# Patient Record
Sex: Female | Born: 1990 | Race: Black or African American | Hispanic: No | Marital: Single | Smoking: Never smoker
Health system: Southern US, Community
[De-identification: ages and names within clinical notes are randomized; demographics above are authoritative.]

## PROBLEM LIST (undated history)

## (undated) ENCOUNTER — Inpatient Hospital Stay (HOSPITAL_COMMUNITY): Payer: Self-pay

## (undated) DIAGNOSIS — B9689 Other specified bacterial agents as the cause of diseases classified elsewhere: Secondary | ICD-10-CM

## (undated) DIAGNOSIS — Z789 Other specified health status: Secondary | ICD-10-CM

## (undated) DIAGNOSIS — N760 Acute vaginitis: Secondary | ICD-10-CM

## (undated) DIAGNOSIS — B3731 Acute candidiasis of vulva and vagina: Secondary | ICD-10-CM

## (undated) DIAGNOSIS — B373 Candidiasis of vulva and vagina: Secondary | ICD-10-CM

## (undated) HISTORY — DX: Acute vaginitis: N76.0

## (undated) HISTORY — DX: Candidiasis of vulva and vagina: B37.3

## (undated) HISTORY — PX: NO PAST SURGERIES: SHX2092

## (undated) HISTORY — DX: Other specified bacterial agents as the cause of diseases classified elsewhere: B96.89

## (undated) HISTORY — DX: Acute candidiasis of vulva and vagina: B37.31

---

## 2016-12-06 ENCOUNTER — Other Ambulatory Visit: Payer: Self-pay

## 2016-12-10 LAB — CYTOLOGY - PAP: DIAGNOSIS: NEGATIVE

## 2019-06-26 ENCOUNTER — Encounter (HOSPITAL_COMMUNITY): Payer: Self-pay | Admitting: Obstetrics and Gynecology

## 2019-06-26 ENCOUNTER — Inpatient Hospital Stay (HOSPITAL_COMMUNITY): Payer: BLUE CROSS/BLUE SHIELD

## 2019-06-26 ENCOUNTER — Other Ambulatory Visit: Payer: Self-pay

## 2019-06-26 ENCOUNTER — Inpatient Hospital Stay (HOSPITAL_COMMUNITY)
Admission: AD | Admit: 2019-06-26 | Discharge: 2019-06-26 | Disposition: A | Discharge status: Home / Self Care | Payer: BLUE CROSS/BLUE SHIELD | Attending: Obstetrics and Gynecology | Admitting: Obstetrics and Gynecology

## 2019-06-26 DIAGNOSIS — O208 Other hemorrhage in early pregnancy: Secondary | ICD-10-CM | POA: Diagnosis not present

## 2019-06-26 DIAGNOSIS — O26891 Other specified pregnancy related conditions, first trimester: Secondary | ICD-10-CM | POA: Diagnosis not present

## 2019-06-26 DIAGNOSIS — K117 Disturbances of salivary secretion: Secondary | ICD-10-CM | POA: Diagnosis not present

## 2019-06-26 DIAGNOSIS — O219 Vomiting of pregnancy, unspecified: Secondary | ICD-10-CM | POA: Diagnosis not present

## 2019-06-26 DIAGNOSIS — O418X10 Other specified disorders of amniotic fluid and membranes, first trimester, not applicable or unspecified: Secondary | ICD-10-CM

## 2019-06-26 DIAGNOSIS — Z3A08 8 weeks gestation of pregnancy: Secondary | ICD-10-CM | POA: Insufficient documentation

## 2019-06-26 DIAGNOSIS — O468X1 Other antepartum hemorrhage, first trimester: Secondary | ICD-10-CM

## 2019-06-26 DIAGNOSIS — O99611 Diseases of the digestive system complicating pregnancy, first trimester: Secondary | ICD-10-CM | POA: Insufficient documentation

## 2019-06-26 DIAGNOSIS — R1030 Lower abdominal pain, unspecified: Secondary | ICD-10-CM | POA: Diagnosis not present

## 2019-06-26 DIAGNOSIS — R112 Nausea with vomiting, unspecified: Secondary | ICD-10-CM | POA: Diagnosis present

## 2019-06-26 DIAGNOSIS — Z3491 Encounter for supervision of normal pregnancy, unspecified, first trimester: Secondary | ICD-10-CM

## 2019-06-26 DIAGNOSIS — R109 Unspecified abdominal pain: Secondary | ICD-10-CM

## 2019-06-26 HISTORY — DX: Other specified health status: Z78.9

## 2019-06-26 LAB — CBC
HCT: 35.3 % — ABNORMAL LOW (ref 36.0–46.0)
Hemoglobin: 11.9 g/dL — ABNORMAL LOW (ref 12.0–15.0)
MCH: 26.7 pg (ref 26.0–34.0)
MCHC: 33.7 g/dL (ref 30.0–36.0)
MCV: 79.3 fL — ABNORMAL LOW (ref 80.0–100.0)
Platelets: 179 10*3/uL (ref 150–400)
RBC: 4.45 MIL/uL (ref 3.87–5.11)
RDW: 12.7 % (ref 11.5–15.5)
WBC: 4.6 10*3/uL (ref 4.0–10.5)
nRBC: 0 % (ref 0.0–0.2)

## 2019-06-26 LAB — URINALYSIS, ROUTINE W REFLEX MICROSCOPIC
Bilirubin Urine: NEGATIVE
Glucose, UA: NEGATIVE mg/dL
Hgb urine dipstick: NEGATIVE
Ketones, ur: 80 mg/dL — AB
Nitrite: NEGATIVE
Protein, ur: 100 mg/dL — AB
Specific Gravity, Urine: 1.023 (ref 1.005–1.030)
pH: 7 (ref 5.0–8.0)

## 2019-06-26 LAB — WET PREP, GENITAL
Clue Cells Wet Prep HPF POC: NONE SEEN
Sperm: NONE SEEN
Trich, Wet Prep: NONE SEEN
Yeast Wet Prep HPF POC: NONE SEEN

## 2019-06-26 LAB — HCG, QUANTITATIVE, PREGNANCY: hCG, Beta Chain, Quant, S: 361974 m[IU]/mL — ABNORMAL HIGH (ref <5)

## 2019-06-26 LAB — POCT PREGNANCY, URINE: Preg Test, Ur: POSITIVE — AB

## 2019-06-26 LAB — HIV ANTIBODY (ROUTINE TESTING W REFLEX): HIV Screen 4th Generation wRfx: NONREACTIVE

## 2019-06-26 MED ORDER — ONDANSETRON 8 MG PO TBDP: 8.0000 mg | ORAL_TABLET | Freq: Three times a day (TID) | ORAL | 2 refills | Status: DC | PRN

## 2019-06-26 MED ORDER — SODIUM CHLORIDE 0.9 % IV SOLN
8.0000 mg | Freq: Once | INTRAVENOUS | Status: AC
  Administered 2019-06-26: 8 mg via INTRAVENOUS
  Filled 2019-06-26: qty 4

## 2019-06-26 MED ORDER — CONCEPT OB 130-92.4-1 MG PO CAPS: 1.0000 | ORAL_CAPSULE | Freq: Every day | ORAL | 12 refills | Status: DC

## 2019-06-26 MED ORDER — ACETAMINOPHEN 500 MG PO TABS: 1000.0000 mg | ORAL_TABLET | Freq: Four times a day (QID) | ORAL | 1 refills | Status: DC | PRN

## 2019-06-26 MED ORDER — PROMETHAZINE HCL 25 MG/ML IJ SOLN
25.0000 mg | Freq: Once | INTRAVENOUS | Status: AC
  Administered 2019-06-26: 25 mg via INTRAVENOUS
  Filled 2019-06-26: qty 1

## 2019-06-26 MED ORDER — GLYCOPYRROLATE 0.2 MG/ML IJ SOLN
0.2000 mg | Freq: Once | INTRAMUSCULAR | Status: AC
  Administered 2019-06-26: 0.2 mg via INTRAVENOUS
  Filled 2019-06-26: qty 1

## 2019-06-26 MED ORDER — GLYCOPYRROLATE 2 MG PO TABS: 2.0000 mg | ORAL_TABLET | Freq: Three times a day (TID) | ORAL | 2 refills | Status: DC | PRN

## 2019-06-26 NOTE — Discharge Instructions (Signed)
Abdominal Pain During Pregnancy ° °Abdominal pain is common during pregnancy, and has many possible causes. Some causes are more serious than others, and sometimes the cause is not known. Abdominal pain can be a sign that labor is starting. It can also be caused by normal growth and stretching of muscles and ligaments during pregnancy. Always tell your health care provider if you have any abdominal pain. °Follow these instructions at home: °· Do not have sex or put anything in your vagina until your pain goes away completely. °· Get plenty of rest until your pain improves. °· Drink enough fluid to keep your urine pale yellow. °· Take over-the-counter and prescription medicines only as told by your health care provider. °· Keep all follow-up visits as told by your health care provider. This is important. °Contact a health care provider if: °· Your pain continues or gets worse after resting. °· You have lower abdominal pain that: °? Comes and goes at regular intervals. °? Spreads to your back. °? Is similar to menstrual cramps. °· You have pain or burning when you urinate. °Get help right away if: °· You have a fever or chills. °· You have vaginal bleeding. °· You are leaking fluid from your vagina. °· You are passing tissue from your vagina. °· You have vomiting or diarrhea that lasts for more than 24 hours. °· Your baby is moving less than usual. °· You feel very weak or faint. °· You have shortness of breath. °· You develop severe pain in your upper abdomen. °Summary °· Abdominal pain is common during pregnancy, and has many possible causes. °· If you experience abdominal pain during pregnancy, tell your health care provider right away. °· Follow your health care provider's home care instructions and keep all follow-up visits as directed. °This information is not intended to replace advice given to you by your health care provider. Make sure you discuss any questions you have with your health care  provider. °Document Released: 06/14/2005 Document Revised: 10/02/2018 Document Reviewed: 09/16/2016 °Elsevier Patient Education © 2020 Elsevier Inc[employer] ° °Morning Sickness ° °Morning sickness is when a woman feels nauseous during pregnancy. This nauseous feeling may or may not come with vomiting. It often occurs in the morning, but it can be a problem at any time of day. Morning sickness is most common during the first trimester. In some cases, it may continue throughout pregnancy. Although morning sickness is unpleasant, it is usually harmless unless the woman develops severe and continual vomiting (hyperemesis gravidarum), a condition that requires more intense treatment. °What are the causes? °The exact cause of this condition is not known, but it seems to be related to normal hormonal changes that occur in pregnancy. °What increases the risk? °You are more likely to develop this condition if: °· You experienced nausea or vomiting before your pregnancy. °· You had morning sickness during a previous pregnancy. °· You are pregnant with more than one baby, such as twins. °What are the signs or symptoms? °Symptoms of this condition include: °· Nausea. °· Vomiting. °How is this diagnosed? °This condition is usually diagnosed based on your signs and symptoms. °How is this treated? °In many cases, treatment is not needed for this condition. Making some changes to what you eat may help to control symptoms. Your health care provider may also prescribe or recommend: °· Vitamin B6 supplements. °· Anti-nausea medicines. °· Ginger. °Follow these instructions at home: °Medicines °· Take over-the-counter and prescription medicines only as told by your health care   provider. Do not use any prescription, over-the-counter, or herbal medicines for morning sickness without first talking with your health care provider. °· Taking multivitamins before getting pregnant can prevent or decrease the severity of morning sickness in most  women. °Eating and drinking °· Eat a piece of dry toast or crackers before getting out of bed in the morning. °· Eat 5 or 6 small meals a day. °· Eat dry and bland foods, such as rice or a baked potato. Foods that are high in carbohydrates are often helpful. °· Avoid greasy, fatty, and spicy foods. °· Have someone cook for you if the smell of any food causes nausea and vomiting. °· If you feel nauseous after taking prenatal vitamins, take the vitamins at night or with a snack. °· Snack on protein foods between meals if you are hungry. Nuts, yogurt, and cheese are good options. °· Drink fluids throughout the day. °· Try ginger ale made with real ginger, ginger tea made from fresh grated ginger, or ginger candies. °General instructions °· Do not use any products that contain nicotine or tobacco, such as cigarettes and e-cigarettes. If you need help quitting, ask your health care provider. °· Get an air purifier to keep the air in your house free of odors. °· Get plenty of fresh air. °· Try to avoid odors that trigger your nausea. °· Consider trying these methods to help relieve symptoms: °? Wearing an acupressure wristband. These wristbands are often worn for seasickness. °? Acupuncture. °Contact a health care provider if: °· Your home remedies are not working and you need medicine. °· You feel dizzy or light-headed. °· You are losing weight. °Get help right away if: °· You have persistent and uncontrolled nausea and vomiting. °· You faint. °· You have severe pain in your abdomen. °Summary °· Morning sickness is when a woman feels nauseous during pregnancy. This nauseous feeling may or may not come with vomiting. °· Morning sickness is most common during the first trimester. °· It often occurs in the morning, but it can be a problem at any time of day. °· In many cases, treatment is not needed for this condition. Making some changes to what you eat may help to control symptoms. °This information is not intended to  replace advice given to you by your health care provider. Make sure you discuss any questions you have with your health care provider. °Document Released: 08/05/2006 Document Revised: 05/27/2017 Document Reviewed: 07/17/2016 °Elsevier Patient Education © 2020 Elsevier Inc. ° °

## 2019-06-26 NOTE — MAU Note (Addendum)
Pt states she has been having nausea and vomiting for 2 weeks. Tried B6 and Unisom and it only worked for a while. Also having abdominal cramping at night. Denies bleeding or vaginal discharge. Had her pregnancy confirmed at Pregnancy center. LMP Nov 2

## 2019-06-26 NOTE — MAU Provider Note (Signed)
Chief Complaint: Nausea and Emesis   First Provider Initiated Contact with Patient 06/26/19 1729     SUBJECTIVE HPI: Anne Young is a 28 y.o. G1P0 at [redacted]w[redacted]d who presents to Maternity Admissions reporting nausea and vomiting and increased saliva x2 weeks that has not improved with vitamin B6 and Unisom.  Also reports lower abdominal cramping times several days.  Positive UPT at pregnancy care center.  Has not had any ultrasounds this pregnancy.  Vaginal Bleeding: Denies Passage of tissue or clots: Denies Dizziness: Denies  Pain Location: Suprapubic Quality: Cramping Severity: Moderate Duration: Several days Course: Unchanged Context: Early pregnancy.  IUP not yet confirmed. Timing: Intermittent, worse at night Modifying factors: Nothing.  Has not tried anything for the pain. Associated signs and symptoms: Negative for fever, chills, diarrhea, constipation, urinary complaints, vaginal discharge.  Positive for nausea and vomiting.  Past Medical History:  Diagnosis Date  . Medical history non-contributory    OB History  Gravida Para Term Preterm AB Living  1            SAB TAB Ectopic Multiple Live Births               # Outcome Date GA Lbr Len/2nd Weight Sex Delivery Anes PTL Lv  1 Current            Past Surgical History:  Procedure Laterality Date  . NO PAST SURGERIES     Social History   Socioeconomic History  . Marital status: Single    Spouse name: Not on file  . Number of children: Not on file  . Years of education: Not on file  . Highest education level: Not on file  Occupational History  . Not on file  Tobacco Use  . Smoking status: Never Smoker  . Smokeless tobacco: Never Used  Substance and Sexual Activity  . Alcohol use: Never  . Drug use: Never  . Sexual activity: Not on file  Other Topics Concern  . Not on file  Social History Narrative  . Not on file   Social Determinants of Health   Financial Resource Strain:   . Difficulty of Paying Living  Expenses: Not on file  Food Insecurity:   . Worried About Programme researcher, broadcasting/film/video in the Last Year: Not on file  . Ran Out of Food in the Last Year: Not on file  Transportation Needs:   . Lack of Transportation (Medical): Not on file  . Lack of Transportation (Non-Medical): Not on file  Physical Activity:   . Days of Exercise per Week: Not on file  . Minutes of Exercise per Session: Not on file  Stress:   . Feeling of Stress : Not on file  Social Connections:   . Frequency of Communication with Friends and Family: Not on file  . Frequency of Social Gatherings with Friends and Family: Not on file  . Attends Religious Services: Not on file  . Active Member of Clubs or Organizations: Not on file  . Attends Banker Meetings: Not on file  . Marital Status: Not on file  Intimate Partner Violence:   . Fear of Current or Ex-Partner: Not on file  . Emotionally Abused: Not on file  . Physically Abused: Not on file  . Sexually Abused: Not on file   No current facility-administered medications on file prior to encounter.   No current outpatient medications on file prior to encounter.   No Known Allergies  I have reviewed the past Medical Hx,  Surgical Hx, Social Hx, Allergies and Medications.   Review of Systems  Constitutional: Negative for appetite change, chills and fever.  Gastrointestinal: Positive for abdominal pain, nausea and vomiting. Negative for constipation and diarrhea.       Ptyalism  Genitourinary: Negative for difficulty urinating, dysuria, flank pain, frequency, hematuria, pelvic pain, vaginal bleeding and vaginal discharge.    OBJECTIVE Patient Vitals for the past 24 hrs:  BP Temp Temp src Pulse Resp SpO2 Height Weight  06/26/19 2048 (!) 107/57 -- -- -- -- -- -- --  06/26/19 1542 (!) 110/59 98.5 F (36.9 C) Oral 84 20 100 % -- --  06/26/19 1539 -- -- -- -- -- -- 5\' 8"  (1.727 m) 69.9 kg   Constitutional: Well-developed, well-nourished female in no acute  distress.  Head: Mucous membranes moist. Cardiovascular: normal rate Respiratory: normal rate and effort.  GI: Abd soft, moderate suprapubic tenderness.  Fundus nonpalpable. MS: Extremities nontender, no edema, normal ROM Neurologic: Alert and oriented x 4.  GU:  SPECULUM EXAM: NEFG, physiologic discharge, no blood noted, cervix clean  Closed; uterus 8-week size, no adnexal tenderness or masses. No CMT.  LAB RESULTS Results for orders placed or performed during the hospital encounter of 06/26/19 (from the past 24 hour(s))  Urinalysis, Routine w reflex microscopic     Status: Abnormal   Collection Time: 06/26/19  3:45 PM  Result Value Ref Range   Color, Urine AMBER (A) YELLOW   APPearance HAZY (A) CLEAR   Specific Gravity, Urine 1.023 1.005 - 1.030   pH 7.0 5.0 - 8.0   Glucose, UA NEGATIVE NEGATIVE mg/dL   Hgb urine dipstick NEGATIVE NEGATIVE   Bilirubin Urine NEGATIVE NEGATIVE   Ketones, ur 80 (A) NEGATIVE mg/dL   Protein, ur 161100 (A) NEGATIVE mg/dL   Nitrite NEGATIVE NEGATIVE   Leukocytes,Ua TRACE (A) NEGATIVE   RBC / HPF 0-5 0 - 5 RBC/hpf   WBC, UA 11-20 0 - 5 WBC/hpf   Bacteria, UA MANY (A) NONE SEEN   Squamous Epithelial / LPF 0-5 0 - 5   Mucus PRESENT   Pregnancy, urine POC     Status: Abnormal   Collection Time: 06/26/19  3:46 PM  Result Value Ref Range   Preg Test, Ur POSITIVE (A) NEGATIVE  hCG, quantitative, pregnancy     Status: Abnormal   Collection Time: 06/26/19  5:40 PM  Result Value Ref Range   hCG, Beta Chain, Quant, S 361,974 (H) <5 mIU/mL  CBC     Status: Abnormal   Collection Time: 06/26/19  5:40 PM  Result Value Ref Range   WBC 4.6 4.0 - 10.5 K/uL   RBC 4.45 3.87 - 5.11 MIL/uL   Hemoglobin 11.9 (L) 12.0 - 15.0 g/dL   HCT 09.635.3 (L) 04.536.0 - 40.946.0 %   MCV 79.3 (L) 80.0 - 100.0 fL   MCH 26.7 26.0 - 34.0 pg   MCHC 33.7 30.0 - 36.0 g/dL   RDW 81.112.7 91.411.5 - 78.215.5 %   Platelets 179 150 - 400 K/uL   nRBC 0.0 0.0 - 0.2 %  ABO/Rh     Status: None   Collection  Time: 06/26/19  5:40 PM  Result Value Ref Range   ABO/RH(D)      A POS Performed at [facility], [address] [place], [place] [id number]   HIV Antibody (routine testing w rflx)     Status: None   Collection Time: 06/26/19  5:40 PM  Result Value Ref Range  HIV Screen 4th Generation wRfx NON REACTIVE NON REACTIVE  Wet prep, genital     Status: Abnormal   Collection Time: 06/26/19  7:36 PM   Specimen: Vaginal  Result Value Ref Range   Yeast Wet Prep HPF POC NONE SEEN NONE SEEN   Trich, Wet Prep NONE SEEN NONE SEEN   Clue Cells Wet Prep HPF POC NONE SEEN NONE SEEN   WBC, Wet Prep HPF POC MODERATE (A) NONE SEEN   Sperm NONE SEEN     IMAGING US OB Comp Less 14 Wks  Result Date: 06/26/2019 CLINICAL DATA:  Nightly cramping EXAM: OBSTETRIC <14 WK ULTRASOUND TECHNIQUE: Transabdominal ultrasound was performed for evaluation of the gestation as well as the maternal uterus and adnexal regions. COMPARISON:  None. FINDINGS: Intrauterine gestational sac: Single Yolk sac:  Visualized. Embryo:  Visualized. Cardiac Activity: Visualized. Heart Rate: 169 bpm CRL: 22.5  mm   8 w 6 d                  [place] EDC: 01/30/2020 Subchorionic hemorrhage:  Small subchorionic hemorrhage. Maternal uterus/adnexae: Corpus luteum cyst in the right ovary. Ovaries are otherwise normal. No pelvic free fluid. IMPRESSION: 1. Single live intrauterine pregnancy as detailed above. 2. Small subchorionic hemorrhage. Attention on follow-up examination is recommended. Electronically Signed   By: Kathreen Devoid   On: 06/26/2019 18:48    MAU COURSE Orders Placed This Encounter  Procedures  . Wet prep, genital  . US OB Comp Less 14 Wks  . Urinalysis, Routine w reflex microscopic  . hCG, quantitative, pregnancy  . CBC  . HIV Antibody (routine testing w rflx)  . Apply warm compress  . Pregnancy, urine POC  . ABO/Rh  . Insert peripheral IV  . Discharge patient   Meds ordered this encounter  Medications  . promethazine  (PHENERGAN) 25 mg in lactated ringers 1,000 mL infusion  . ondansetron (ZOFRAN) 8 mg in sodium chloride 0.9 % 50 mL IVPB  . glycopyrrolate (ROBINUL) injection 0.2 mg  . glycopyrrolate (ROBINUL) 2 MG tablet    Sig: Take 1 tablet (2 mg total) by mouth 3 (three) times daily as needed.    Dispense:  30 tablet    Refill:  2    Order Specific Question:   Supervising Provider    Answer:   Sloan Leiter [[id number]]  . acetaminophen (TYLENOL) 500 MG tablet    Sig: Take 2 tablets (1,000 mg total) by mouth every 6 (six) hours as needed for moderate pain.    Dispense:  30 tablet    Refill:  1    Order Specific Question:   Supervising Provider    Answer:   Sloan Leiter [[id number]]  . ondansetron (ZOFRAN ODT) 8 MG disintegrating tablet    Sig: Take 1 tablet (8 mg total) by mouth every 8 (eight) hours as needed for nausea or vomiting.    Dispense:  20 tablet    Refill:  2    Order Specific Question:   Supervising Provider    Answer:   Sloan Leiter [[id number]]  . Prenat w/o A Vit-FeFum-FePo-FA (CONCEPT OB) 130-92.4-1 MG CAPS    Sig: Take 1 tablet by mouth daily.    Dispense:  30 capsule    Refill:  12    Order Specific Question:   Supervising Provider    Answer:   Sloan Leiter [[id number]]   No improvement in nausea after Phenergan.  Ate soup and feels like she is about  to vomit again.  Lengthy discussion about small studies raising concern about increased risk of birth defects when Zofran is used during organogenesis followed by a larger study showing no increase in risk of birth defects.  Patient verbalizes understanding and request to have Zofran.   Patient reports feeling much better and looks better as well.  Requesting Rx Zofran for home.  MDM - Pain in early pregnancy with normal intrauterine pregnancy and hemodynamically stable.  Pain possibly secondary to corpus luteum cyst, subchorionic hemorrhage or muscular strain and vomiting.  No evidence of ectopic pregnancy, PID, miscarriage or other  emergent condition.  GC/chlamydia cultures pending.  No leukocytosis.  Comfort measures, extra strength Tylenol as needed for pain.  - Nausea and vomiting of pregnancy will controlled by Zofran.  Rx given.  Instructed to advance diet slowly.  -Ptyalism.  Rx Robinul.  ASSESSMENT 1. Subchorionic hemorrhage of placenta in first trimester, single or unspecified fetus   2. Abdominal pain during pregnancy in first trimester   3. Normal IUP (intrauterine pregnancy) on prenatal ultrasound, first trimester   4. Nausea and vomiting during pregnancy   5. Ptyalism     PLAN Discharge home in stable condition. 1st trimester precautions Follow-up Information    Your obstetrician Follow up.   Why: Start prenatal care       [facility] Follow up.   Specialty: Obstetrics and Gynecology Why: As needed in pregnancy emergencies Contact information: [address] [id number] [place] [address] [phone]         Allergies as of 06/26/2019   No Known Allergies     Medication List    TAKE these medications   acetaminophen 500 MG tablet Commonly known as: TYLENOL Take 2 tablets (1,000 mg total) by mouth every 6 (six) hours as needed for moderate pain.   Concept OB 130-92.4-1 MG Caps Take 1 tablet by mouth daily.   glycopyrrolate 2 MG tablet Commonly known as: ROBINUL Take 1 tablet (2 mg total) by mouth 3 (three) times daily as needed.   ondansetron 8 MG disintegrating tablet Commonly known as: Zofran ODT Take 1 tablet (8 mg total) by mouth every 8 (eight) hours as needed for nausea or vomiting.        Katrinka Blazing, [place], CNM 06/26/2019  9:05 PM  4

## 2019-06-27 LAB — GC/CHLAMYDIA PROBE AMP (~~LOC~~) NOT AT ARMC
Chlamydia: NEGATIVE
Comment: NEGATIVE
Comment: NORMAL
Neisseria Gonorrhea: NEGATIVE

## 2019-06-27 LAB — ABO/RH: ABO/RH(D): A POS

## 2019-06-29 NOTE — L&D Delivery Note (Signed)
[facility]N [facility] Delivery Note  Anne Young is a 29 y.o. G2P0010 s/p vag del at [redacted]w[redacted]d. She was admitted for IOL due to posterm preg.    ROM: 3h 63m (SROM) with light MSF fluid GBS Status: positive Maximum Maternal Temperature: 98.4  Labor Progress: Marland Kitchen Anne Young was admitted for IOL and was given a single dose of buccal cytotec, which she proceeded to labor from and delivered approx 10hrs afterwards.  Delivery Date/Time: February 07, 2020 at 0358 Delivery: Called to room and patient was complete and pushing. Head delivered ROA. No nuchal cord present. Shoulder and body delivered in usual fashion. Infant with spontaneous cry, placed on mother's abdomen, dried and stimulated. Cord clamped x 2 after 1-minute delay, and cut by [name]. Cord blood drawn. Placenta delivered spontaneously with gentle cord traction. Fundus firm with massage and Pitocin. Labia, perineum, vagina, and cervix inspected and found to have a 1st deg perineal lac repaired in the usual fashion with 3-0 monocryl, and a periurethral abrasion, hemostatic and not repaired.   Placenta: spont, intact Complications: none Lacerations: 1st deg perineal EBL: 500cc Analgesia: 1% lidocaine  Postpartum Planning [x]  message to sent to schedule follow-up    Infant: girl  APGARs 8/9  3779gm (8lb 5.3oz)  10/9, CNM  02/07/2020 4:37 AM

## 2019-07-11 ENCOUNTER — Telehealth: Payer: Self-pay | Admitting: Student

## 2019-07-11 NOTE — Telephone Encounter (Signed)
Attempted to call patient to inform her of changes made in the office affected her intake appointment. We were able to reschedule her to 01/20 at 2:30 pm. A voicemail message was left for her.

## 2019-07-18 ENCOUNTER — Ambulatory Visit (INDEPENDENT_AMBULATORY_CARE_PROVIDER_SITE_OTHER): Payer: BLUE CROSS/BLUE SHIELD | Admitting: *Deleted

## 2019-07-18 ENCOUNTER — Other Ambulatory Visit: Payer: Self-pay

## 2019-07-18 DIAGNOSIS — Z3490 Encounter for supervision of normal pregnancy, unspecified, unspecified trimester: Secondary | ICD-10-CM | POA: Diagnosis not present

## 2019-07-18 DIAGNOSIS — O0993 Supervision of high risk pregnancy, unspecified, third trimester: Secondary | ICD-10-CM | POA: Insufficient documentation

## 2019-07-18 MED ORDER — BLOOD PRESSURE KIT DEVI: 1.0000 | 0 refills | Status: DC | PRN

## 2019-07-18 NOTE — Progress Notes (Signed)
I connected with  Anne Young on 07/18/19 at  2:30 PM EST by telephone and verified that I am speaking with the correct person using two identifiers.   I discussed the limitations, risks, security and privacy concerns of performing an evaluation and management service by telephone and the availability of in person appointments. I also discussed with the patient that there may be a patient responsible charge related to this service. The patient expressed understanding and agreed to proceed. Explained I am completing her New OB Intake today. We discussed Her EDD and that it is based on  sure LMP . I reviewed her allergies, meds, OB History, Medical /Surgical history, and appropriate screenings.I informed her of [facility] services. She still c/o nausea. We discussed she has tried unisom/Vitamin B6 without relief and phergan without relief. She states she is using the zofran about once a day but doesn't want to use it more often because of possible side effects. She states not using robinol much because of constipation. I informed her if needed may use colace bid and metamucil as needed.I also informed her to let us know if that does not help. I encouraged her to discuss nausea and / or constipation with provider at first ob visit if needed. She c/o cramps at times. We discussed if she has vaginal bleeding or severe pain needs to go to [facility] to be evaluated . If mild cramps it may be round ligament pain and she  may take tylenol as needed, reposition for comfort.   I explained I will send her the [employer] app- app sent to her while on phone.  I explained we will send a blood pressure cuff to [facility] that will fill that prescription and we called [facility] and they will deliver the cuff to our office at her request. We will show her how to use it. Explained  then we will have her take her blood pressure weekly and enter into the app. Explained she will have some visits in office and some virtually. She already  has MyChart and I  asisted her with downloading the app. I reviewed her new ob  appointment date/ time with her , our location and to wear mask, no visitors.  I explained she will have a pelvic exam, ob bloodwork, hemoglobin a1C, cbg , genetic testing if desired,- she is undecided if she wants a panorama. I scheduled an [place] at 19 weeks and gave her the appointment. She voices understanding.   [name],RN 07/18/2019  2:39 PM

## 2019-07-18 NOTE — Patient Instructions (Signed)

## 2019-07-24 ENCOUNTER — Ambulatory Visit (INDEPENDENT_AMBULATORY_CARE_PROVIDER_SITE_OTHER): Payer: Medicaid Other | Admitting: Medical

## 2019-07-24 ENCOUNTER — Encounter: Payer: Self-pay | Admitting: Medical

## 2019-07-24 ENCOUNTER — Other Ambulatory Visit: Payer: Self-pay

## 2019-07-24 VITALS — BP 109/75 | HR 93 | Wt 146.9 lb

## 2019-07-24 DIAGNOSIS — O219 Vomiting of pregnancy, unspecified: Secondary | ICD-10-CM

## 2019-07-24 DIAGNOSIS — Z3491 Encounter for supervision of normal pregnancy, unspecified, first trimester: Secondary | ICD-10-CM | POA: Diagnosis not present

## 2019-07-24 DIAGNOSIS — Z3A12 12 weeks gestation of pregnancy: Secondary | ICD-10-CM | POA: Diagnosis not present

## 2019-07-24 DIAGNOSIS — R8271 Bacteriuria: Secondary | ICD-10-CM

## 2019-07-24 LAB — POCT URINALYSIS DIP (DEVICE)
Glucose, UA: NEGATIVE mg/dL
Nitrite: NEGATIVE
Protein, ur: 30 mg/dL — AB
Specific Gravity, Urine: 1.025 (ref 1.005–1.030)
Urobilinogen, UA: 0.2 mg/dL (ref 0.0–1.0)
pH: 6 (ref 5.0–8.0)

## 2019-07-24 NOTE — Progress Notes (Signed)
Gave Pt BP Cuff & educated on use. Medicaid Home form Completed-07/24/19

## 2019-07-24 NOTE — Progress Notes (Signed)
   PRENATAL VISIT NOTE  Subjective:  Anne Young is a 29 y.o. G2P0010 at [redacted]w[redacted]d being seen today for her first prenatal visit for this pregnancy.  She is currently monitored for the following issues for this low-risk pregnancy and has Supervision of low-risk pregnancy on their problem list.  Patient reports nausea and vomiting.  Contractions: Not present. Vag. Bleeding: None.  Movement: Absent. Denies leaking of fluid.   She is planning to breastfeed. Desires Nexplanon for contraception.   The following portions of the patient's history were reviewed and updated as appropriate: allergies, current medications, past family history, past medical history, past social history, past surgical history and problem list.   Objective:   Vitals:   07/24/19 1022  BP: 109/75  Pulse: 93  Weight: 146 lb 14.4 oz (66.6 kg)    Fetal Status: Fetal Heart Rate (bpm): 156   Movement: Absent     General:  Alert, oriented and cooperative. Patient is in no acute distress.  Skin: Skin is warm and dry. No rash noted.   Cardiovascular: Normal heart rate and rhythm noted  Respiratory: Normal respiratory effort, no problems with respiration noted. Clear to auscultation.   Abdomen: Soft, gravid, appropriate for gestational age. Normal bowel sounds. Non-tender. Pain/Pressure: Present     Pelvic: Cervical exam performed Dilation: Closed Effacement (%): Thick   Breast: symmetric, no masses  Extremities: Normal range of motion.  Edema: None  Mental Status: Normal mood and affect. Normal behavior. Normal judgment and thought content.    Results for orders placed or performed in visit on 07/24/19 (from the past 24 hour(s))  POCT urinalysis dip (device)     Status: Abnormal   Collection Time: 07/24/19 10:53 AM  Result Value Ref Range   Glucose, UA NEGATIVE NEGATIVE mg/dL   Bilirubin Urine SMALL (A) NEGATIVE   Ketones, ur TRACE (A) NEGATIVE mg/dL   Specific Gravity, Urine 1.025 1.005 - 1.030   Hgb urine dipstick  TRACE (A) NEGATIVE   pH 6.0 5.0 - 8.0   Protein, ur 30 (A) NEGATIVE mg/dL   Urobilinogen, UA 0.2 0.0 - 1.0 mg/dL   Nitrite NEGATIVE NEGATIVE   Leukocytes,Ua LARGE (A) NEGATIVE    Assessment and Plan:  Pregnancy: G2P0010 at [redacted]w[redacted]d 1. Encounter for supervision of low-risk pregnancy in first trimester - Culture, OB Urine - Genetic Screening - Obstetric Panel, Including HIV - Anatomy US scheduled 09/10/19  2. Nausea and vomiting in pregnancy prior to [redacted] weeks gestation - Taking Zofran PRN, tried Phenergan without relief - Stopped Robinul due to constipation - Tried Unisom without relief  - Declines any other medications - Discussed diet for N/V in pregnancy  - UA without significant dehydration   Preterm labor/first trimester warning symptoms and general obstetric precautions including but not limited to vaginal bleeding, contractions, leaking of fluid and fetal movement were reviewed in detail with the patient. Please refer to After Visit Summary for other counseling recommendations.   Return in about 4 weeks (around 08/21/2019) for LOB, Virtual.  Future Appointments  Date Time Provider Department Center  09/10/2019  9:30 AM [facility] [place] 1 [facility]    Vonzella Nipple, PA-C

## 2019-07-24 NOTE — Patient Instructions (Addendum)
Safe Medications in Pregnancy   Acne:  Benzoyl Peroxide  Salicylic Acid   Backache/Headache:  Tylenol: 2 regular strength every 4 hours OR        2 Extra strength every 6 hours   Colds/Coughs/Allergies:  Benadryl (alcohol free) 25 mg every 6 hours as needed  Breath right strips  Claritin  Cepacol throat lozenges  Chloraseptic throat spray  Cold-Eeze- up to three times per day  Cough drops, alcohol free  Flonase (by prescription only)  Guaifenesin  Mucinex  Robitussin DM (plain only, alcohol free)  Saline nasal spray/drops  Sudafed (pseudoephedrine) & Actifed * use only after [redacted] weeks gestation and if you do not have high blood pressure  Tylenol  Vicks Vaporub  Zinc lozenges  Zyrtec   Constipation:  Colace  Ducolax suppositories  Fleet enema  Glycerin suppositories  Metamucil  Milk of magnesia  Miralax  Senokot  Smooth move tea   Diarrhea:  Kaopectate  Imodium A-D   *NO pepto Bismol   Hemorrhoids:  Anusol  Anusol HC  Preparation H  Tucks   Indigestion:  Tums  Maalox  Mylanta  Zantac  Pepcid   Insomnia:  Benadryl (alcohol free) 25mg  every 6 hours as needed  Tylenol PM  Unisom, no Gelcaps   Leg Cramps:  Tums  MagGel   Nausea/Vomiting:  Bonine  Dramamine  Emetrol  Ginger extract  Sea bands  Meclizine  Nausea medication to take during pregnancy:  Unisom (doxylamine succinate 25 mg tablets) Take one tablet daily at bedtime. If symptoms are not adequately controlled, the dose can be increased to a maximum recommended dose of two tablets daily (1/2 tablet in the morning, 1/2 tablet mid-afternoon and one at bedtime).  Vitamin B6 100mg  tablets. Take one tablet twice a day (up to 200 mg per day).   Skin Rashes:  Aveeno products  Benadryl cream or 25mg  every 6 hours as needed  Calamine Lotion  1% cortisone cream   Yeast infection:  Gyne-lotrimin 7  Monistat 7    **If taking multiple medications, please check labels to avoid  duplicating the same active ingredients  **take medication as directed on the label  ** Do not exceed 4000 mg of tylenol in 24 hours  **Do not take medications that contain aspirin or ibuprofen         AREA PEDIATRIC/FAMILY PRACTICE PHYSICIANS  [place] ( ) . [facility] [place]; [id number], MD; [facility], MD; Melodie Bouillon, MD; McDiarmid, MD; Lum Babe, MD; Sheffield Slider, MD; Leveda Anna, MD o [address] [place] [place], [address] [place] o [phone] o Mon-Fri 8:30-12:30, 1:30-5:00 o Providers come to see babies at [facility] o Accepting Medicaid . [facility] [place] o Limited providers who accept newborns: [phone], MD; [facility], MD; [place], MD o [address], [place], Paulino Rily [address] o [phone] o Mon-Fri 8:00-5:30 o Babies seen by providers at [facility] o Does NOT accept Medicaid o Please call early in hospitalization for appointment (limited availability)  . [facility] [phone], MD o [address], [place], Fatima Sanger [address] o [phone] o Mon, Tue, Thur, Fri 8:30-5:00, Wed 10:00-7:00 (closed 1-2pm) o Babies seen by [facility] providers o Accepting Medicaid . 01-26-1970 - Pediatrician 06-15-1999, MD o [address], [place], Fae Pippin [place] o [phone] o Mon-Fri 8:30-5:00, Sat 8:30-12:00 o Provider comes to see babies at [facility] o Accepting Medicaid o Must have been referred from current patients or contacted office  prior to delivery . [facility]) Leotis Pain, MD; Ave Filter, MD; Luna Fuse, MD; Kennedy Bucker, MD; Konrad Dolores, MD; Kathlene November, MD; Jenne Campus, MD; Lubertha South, MD; Wynetta Emery, MD; Duffy Rhody, MD; Gerre Couch, NP; Shirl Harris, NP o [address] [place], [place], [place] [address] o [phone] o Mon, Tue, Thur, Fri 8:30-5:30, Wed 9:30-5:30, Sat 8:30-12:30 o Babies seen by [facility]  providers o Accepting Medicaid o Only accepting infants of first-time parents or siblings of current patients [facility] discharge coordinator will make follow-up appointment . Cyril Mourning o [address] [place], [place]  [address] o [phone]   Fax - [phone] . [facility] o [address], [place], [place]  [address] o Phone - [phone]   Fax - [phone] . Lucio Edward o [address], [place], [place]  [address] o [phone]  [place] [phone]) . [facility] Jorge Mandril, MD; Alita Chyle, MD; Princella Ion, MD; MD; Earlene Plater, MD; Jamesetta Orleans, MD; Alvera Novel, MD; Clarene Duke, MD; Rana Snare, MD; Carmon Ginsberg, MD; Alinda Money, MD; Hosie Poisson, MD; Mayford Knife, MD o [address] [place], [place] [address] o [phone] o Mon-Fri 8:30-5:00 (extended evenings Mon-Thur as needed), Sat-Sun 10:00-1:00 o Providers come to see babies at [facility] o Accepting Medicaid for families of first-time babies and families with all children in the household age 47 and under. Must register with office prior to making appointment (M-F only). [facility] Odella Aquas, NP; Lynelle Doctor, MD; Susann Givens, MD; [place], [place] o [address], [place], [place] [address] o [phone] o Mon-Fri 8:00-5:00 o Babies seen by providers at [facility] o Does NOT accept Medicaid/Commercial Insurance Only . [facility] [place] ([facility])  Suzette Battiest, MD; Zachery Dauer, MD; Stefan Church, MD; Sabino Dick, MD; Quitman Livings, MD; Farris Has, MD; Gaynell Face, MD; Betha Loa, MD; Colon Flattery, MD; Clifton James, MD o [address] [place] [place], [place] [address] o [phone] o Mon-Fri 8:30-5:30, Sat (Oct.-Mar.) 9:00-1:00 o Babies seen by providers at [facility] o Accepting [facility] [phone]) . [facility] D[facility], MD; Sheliah Hatch, MD o [address], [place], [place] [address] o [phone] o Mon-Fri 8:30-5:00, Sat  8:30-12:00 o Providers come to see babies at [facility] o Does NOT accept Medicaid . [facility] Cindy Hazy, [place]; Frannie, MD; [place], [place]; Wynelle Link, MD; Azucena Cecil, MD o [address], [place], [place] [address] o [phone] o Mon-Fri 8:00-5:00 o Babies seen by providers at [facility] o Does NOT accept Medicaid o Only accepting babies of parents who are patients o Please call early in hospitalization for appointment (limited availability) . [facility] Lamar Benes, MD; Abran Cantor, MD; Early Osmond, MD; Cherre Huger, NP; Hyacinth Meeker, MD; Dwan Bolt, MD; Jarold Motto, NP; Dario Guardian, MD; Talmage Nap, MD; Maisie Fus, MD; Pricilla Holm, MD; Tama High, MD o [address] [place], [place], [place] [address] o [phone] o Mon-Fri 8:00-5:00, Sat 9:00-12:00 o Providers come to see babies at [facility] o Does NOT accept [facility] [phone]) . [facility] o Limited providers accepting new patients: Drema Pry, NP; [place], [place] o [address], [place], [place] [address] o [phone] o Mon-Fri 8:00-5:00 o Babies seen by providers at [facility] o Does NOT accept Medicaid o Only accepting babies of parents who are patients o Please call early in hospitalization for appointment (limited availability) . [facility] Luan Pulling, MD; Nash Dimmer, MD o [address] [place] [address] [place], [place] [address]  o [phone] (press 1 to schedule appointment) o Mon-Fri 8:00-5:00 o Providers come to see babies at [facility] o Does NOT accept Medicaid . [facility] Cristino Martes, MD o [address], [place], [place] [address] o [phone] o Mon-Fri 8:30-5:00 (lunch 12:30-1:00), extended hours by appointment only Wed 5:00-6:30 o Babies seen by [facility] providers o Accepting Medicaid . [facility] Abbot, MD; [place], MD; Hassan Rowan, MD o [address] [place], [place], [place] [address] o [phone] o Mon-Fri  8:00-5:00 o Babies seen by [facility] providers o Does NOT accept Medicaid . Nature conservation officer at [place] [address] Elsworth Soho, MD; Durene Cal, MD; [place], DO o [address] [place], [place] [address] o [phone] o Mon-Fri 8:00-5:00 o Babies seen by [facility] providers o Does NOT accept Medicaid . [facility] o [place], [place]; [place], [place]; [place], NP; Avis Epley, MD; Vonna Kotyk, MD; Clance Boll, MD; Stevphen Rochester, NP; Arvilla Market, NP; Ann Maki, NP; Otis Dials, NP; Vaughan Basta, MD; [place], MD o [address] [place], [place] [address] o [phone] o Mon-Fri 8:30-5:00, Sat 10:00-1:00 o Providers come to see babies at [facility] o Does NOT accept Medicaid o Free prenatal information session Tuesdays at 4:45pm . [facility] Luna Kitchens, MD; [place], [place]; [place], [place]; [place], [place] o [address] [place] [address] o [phone] o Mon-Fri 7:30-5:30 o Babies seen by [facility] providers . [facility] Doctor o [address], [place], [place]  [address] o [phone]   Fax - [phone]  [place] [phone] & [phone]) . [facility] Alphonsa Overall, MD o [address] [place], [place] [address] o [phone] o Mon-Thur 8:00-6:00 o Providers come to see babies at [facility] o Accepting Medicaid . [facility] Zenon Mayo, NP; Cyndia Bent, MD; [place], [place]; [place], [place] o [address] [place], [place] [address] o [phone] o Mon-Thur 7:30-7:30, Fri 7:30-4:30 o Babies seen by [facility] providers o Accepting Medicaid . [facility] Cheryle Horsfall, MD; Janene Harvey, NP; Vonita Moss, MD o [address], [place], [place] [address] o [phone] o Mon-Fri 8:30-5:00, Sat 8:30-12:00 o Providers come to see babies at [facility] o Accepting Medicaid o Must have "Meet & Greet" appointment at office prior to delivery . [facility] [place] ([facility]) Llana Aliment, MD; Earlene Plater, MD; Lucretia Roers, MD o [address], [place], [place] [address] o [phone] o Mon-Wed 8:00-6:00, Thur-Fri 8:00-5:00, Sat 9:00-12:00 o Providers come to see babies at [facility] o Does NOT accept Medicaid o Only accepting siblings of current patients . [facility]  o [address], [place], [place]  [address] o [phone]   Fax - [phone] . [facility] at [facility] o [phone] [address], [place], [place]  [address] o [phone]   Fax - [phone]  [place] [phone] & [phone]) . Nature conservation officer at [facility] o [place], DO; [place], DO o [address] [place], [place] [address] o [phone] o Mon-Fri 7:00-5:00 o Babies seen by [facility] providers o Does NOT accept Medicaid . [facility] Ellis Savage, MD; [place], [place]; [employer], [place] o [address], [place], [place] [address] o [phone] o Mon-Fri 8:00-5:00 o Babies seen by [facility] providers o Accepting Medicaid . [facility] - [address] Franne Forts, MD; [place], [place]; [place]co, NP; [place], [place] o [address] [place], [place], [place] [address]  o [phone] o Mon-Fri 8:00-5:00 o Babies seen by providers at [facility] o Accepting [facility] [phone]) . [facility] [place], [place] o [address] [place], [place] [address] o [phone] o Mon-Fri 8:00-5:00 o Babies seen by [facility] providers o Does NOT accept Medicaid o Limited availability, please call early in hospitalization to schedule follow-up . [facility] Jolee Ewing, PA; Eddie Candle, MD; [place], MD; [place], [place]; Constance Goltz, MD; [place], [place] o [facility] [address], [place], [place] [address] o [phone] o Mon-Fri 8:30-5:00, Sat 9:00-12:00 o Babies seen by providers at  [facility] o Accepting Medicaid o Please register online then schedule online or call office o [email or web address] . [facility]) Samuella Bruin, NP; Lucianne Muss, MD; Lanier Clam, PA o [address], [place], [place] [address] o [phone] o Mon-Fri 8:00-5:00 o Babies seen by providers at [facility] o Accepting Medicaid . [facility] [place] ([facility]) Sharin Mons, MD; Reed Breech, NP; Shelva Majestic, MD o [address], [place], [place] [address] o [phone] o Mon-Fri 8:00-5:30, Sat&Sun by appointment (phones open at 8:30) o Babies seen by [facility] providers o Accepting Medicaid o Must be a first-time baby or sibling of current patient . [facility] [address], [place], [place]  [address] o [phone]   Fax - [phone]  [place] [phone] & [phone]) . [facility] Medicine o [place], [place]; [place], [place]; Dimple Casey, MD; [place], [place]; Carolyne Fiscal, MD o [address] [place], [place] [address] o [phone] o Mon-Thur 8:00-7:00, Fri 8:00-5:00, Sat 8:00-12:00, Sun 9:00-12:00 o Babies seen by [facility] providers o Accepting Medicaid . [facility] at [facility], MD; Gaynell Face, MD; [facility], MD o [address], [place], [place] [address] o [phone] o Mon-Thur 8:00-5:00 o Babies seen by providers at [facility] o Accepting Medicaid . [facility] [place] Gwenlyn Saran, MD; [place], MD; Madilyn Fireman, MD; Melvyn Neth, MD; List, MD; Lazarus Salines, MD; Gaynell Face, MD; Berneda Rose, MD; Flora Lipps, MD; Beryl Meager, MD; Luther Redo, MD; Lavonia Drafts, MD; Kellie Simmering, MD o [address] [place] [place], [place] [address] o [phone] o Mon-Fri 8:00-5:30, Sat (Oct.-Mar.) 9:00-1:00 o Babies seen by providers at [facility] o Accepting Medicaid o Must fill out  new patient packet, available online at [email or web address] . [facility] [place] [facility] at [facility]) Simone Curia, NP; Tiburcio Pea, NP; Tresa Endo, NP; Whitney Post, MD; [place], [place]; Hennie Duos, MD; Wynne Dust, MD; Kavin Leech, NP o [address], [place], [place] [address] o [phone] o Mon-Thur 8:00-5:30, Fri 8:00-5:00 o Babies seen by providers at [facility] o Accepting [facility] [phone]) . [facility] o [place], [place]; [place], MD; Tanya Nones, MD; [place], [place] o [address] [place], [place] [address] o [phone] o Mon-Fri 8:00-5:00 o Babies seen by providers at [facility] o Accepting [facility] [phone]) . [facility] at [facility] o [place], DO; Lenise Arena, MD; [place], [place] o [address], [place], [place] [address] o [phone] o Mon-Fri 8:00-5:00 o Babies seen by providers at [facility] o Does NOT accept Medicaid o Limited appointment availability, please call early in hospitalization  . Nature conservation officer at [facility] o [place], [place];  [place], MD o [address] [place], [place] [address] o [phone] o Mon-Fri 8:00-5:00 o Babies seen by [facility] providers o Does NOT accept Medicaid . [facility] Lorrine Kin, MD; Ninetta Lights, MD; [place], [place]; [place], MD o [address] [facility] [address], [place], [place] [address] o [phone] o Mon-Fri 8:00-5:00 o After hours clinic [facility] [place], [place] [address]) [phone] Mon-Fri 5:00-8:00, Sat 12:00-6:00, Sun 10:00-4:00 o Babies seen by [facility] providers o Accepting Medicaid . [facility] o [address] [place], [place]  [address] o [phone]   Fax - [phone]  [place] [phone]) . Nature conservation officer at [facility], MD o [address] [place], [place], [place] [address] o [phone] o Mon-Fri 8:00-5:00 o Babies seen by [facility] providers o Does NOT accept Medicaid . [facility] - [place] [facility] [place]) Tomi Likens, MD o [address] [place] [address] [place], [place] [address] o [phone] o Mon-Thur 8:00-7:00, Fri 8:00-5:00, Sat 8:00-12:00 o Babies seen by providers at [facility] o Accepting Medicaid - but does not have vaccinations in office (must be received elsewhere) o Limited availability, please call early in hospitalization  [place] ([phone]) . [facility]  o Wyvonne Lenz, MD o [address], [place] [address] o [phone]  Fax [phone]   Hyperemesis Gravidarum Hyperemesis gravidarum is a severe form of nausea and vomiting that happens during pregnancy. Hyperemesis is worse than morning sickness. It may cause you to have nausea or vomiting all day for many days. It may keep you from eating and drinking enough food and liquids, which can lead to dehydration, malnutrition, and weight loss. Hyperemesis usually occurs during the first half (the first 20 weeks) of pregnancy. It often goes away once a woman is in her second half of pregnancy. However, sometimes hyperemesis continues through an entire pregnancy. What are the causes? The cause of this condition is not known. It may be related to changes in chemicals (hormones) in the body during pregnancy, such as the high level of pregnancy hormone (human chorionic gonadotropin) or the increase in the female sex hormone (estrogen). What are the signs or symptoms? Symptoms of this condition include:  Nausea that does not go away.  Vomiting that does not allow you to keep any food down.  Weight loss.  Body fluid loss (dehydration).  Having no desire to eat, or not liking food that you have previously enjoyed. How is this diagnosed? This condition may be diagnosed based on:  A physical exam.  Your medical history.  Your symptoms.  Blood tests.  Urine tests. How is this  treated? This condition is managed by controlling symptoms. This may include:  Following an eating plan. This can help lessen nausea and vomiting.  Taking prescription medicines. An eating plan and medicines are often used together to help control symptoms. If medicines do not help relieve nausea and vomiting, you may need to receive fluids through an IV at the hospital. Follow these instructions at home: Eating and drinking   Avoid the following: ? Drinking fluids with meals. Try not to drink anything during the 30 minutes before and after your meals. ? Drinking more than 1 cup of fluid at a time. ? Eating foods that trigger your symptoms. These may include spicy foods, coffee, high-fat foods, very sweet foods, and acidic foods. ? Skipping meals. Nausea can be more intense on an empty stomach.  If you cannot tolerate food, do not force it. Try sucking on ice chips or other frozen items and make up for missed calories later. ? Lying down within 2 hours after eating. ? Being exposed to environmental triggers. These may include food smells, smoky rooms, closed spaces, rooms with strong smells, warm or humid places, overly loud and noisy rooms, and rooms with motion or flickering lights. Try eating meals in a well-ventilated area that is free of strong smells. ? Quick and sudden changes in your movement. ? Taking iron pills and multivitamins that contain iron. If you take prescription iron pills, do not stop taking them unless your health care provider approves. ? Preparing food. The smell of food can spoil your appetite or trigger nausea.  To help relieve your symptoms: ? Listen to your body. Everyone is different and has different preferences. Find what works best for you. ? Eat and drink slowly. ? Eat 5-6 small meals daily instead of 3 large meals. Eating small meals and snacks can help you avoid an empty stomach. ? In the morning, before getting out of bed, eat a couple of crackers to avoid  moving around on an empty stomach. ? Try eating starchy foods as these are usually tolerated well. Examples include cereal, toast, bread, potatoes, pasta, rice, and pretzels. ? Include at least 1 serving of protein with your meals and snacks. Protein options include lean meats, poultry, seafood, beans, nuts, nut butters, eggs, cheese, and yogurt. ? Try eating a protein-rich snack before bed. Examples of a protein-rick snack include cheese and crackers or a peanut butter sandwich made with 1 slice of whole-wheat bread and 1 tsp (5 g) of peanut butter. ? Eat or suck on things that have ginger in them. It may help relieve nausea. Add  tsp ground ginger to hot tea or choose ginger tea. ? Try drinking 100% fruit juice or an electrolyte drink. An electrolyte drink contains sodium, potassium, and chloride. ? Drink fluids that are cold, clear, and carbonated or sour. Examples include lemonade, ginger ale, lemon-lime soda, ice water, and sparkling water. ? Brush your teeth or use a mouth rinse after meals. ? Talk with your health care provider about starting a supplement of vitamin B6. General instructions  Take over-the-counter and prescription medicines only as told by your health care provider.  Follow instructions from your health care provider about eating or drinking restrictions.  Continue to take your prenatal vitamins as told by your health care provider. If you are having trouble taking your prenatal vitamins, talk with your health care provider about different options.  Keep all follow-up and pre-birth (prenatal) visits as told by your health care provider. This is important. Contact a health care provider if:  You have pain in your abdomen.  You have a severe headache.  You have vision problems.  You are losing weight.  You feel weak or dizzy. Get help right away if:  You cannot drink fluids without vomiting.  You vomit blood.  You have constant nausea and vomiting.  You are  very weak.  You faint.  You have a fever and your symptoms suddenly get worse. Summary  Hyperemesis gravidarum is a severe form of nausea and vomiting that happens during pregnancy.  Making some changes to your eating habits may help relieve nausea and vomiting.  This condition may be managed with medicine.  If medicines do not help relieve nausea and vomiting, you may need to receive fluids through an IV at the  hospital. This information is not intended to replace advice given to you by your health care provider. Make sure you discuss any questions you have with your health care provider. Document Revised: 07/04/2017 Document Reviewed: 02/11/2016 Elsevier Patient Education  [place].

## 2019-07-25 LAB — OBSTETRIC PANEL, INCLUDING HIV
Antibody Screen: NEGATIVE
Basophils Absolute: 0 10*3/uL (ref 0.0–0.2)
Basos: 1 %
EOS (ABSOLUTE): 0.2 10*3/uL (ref 0.0–0.4)
Eos: 5 %
HIV Screen 4th Generation wRfx: NONREACTIVE
Hematocrit: 35.1 % (ref 34.0–46.6)
Hemoglobin: 11.7 g/dL (ref 11.1–15.9)
Hepatitis B Surface Ag: NEGATIVE
Immature Grans (Abs): 0 10*3/uL (ref 0.0–0.1)
Immature Granulocytes: 0 %
Lymphocytes Absolute: 1.3 10*3/uL (ref 0.7–3.1)
Lymphs: 32 %
MCH: 26.9 pg (ref 26.6–33.0)
MCHC: 33.3 g/dL (ref 31.5–35.7)
MCV: 81 fL (ref 79–97)
Monocytes Absolute: 0.3 10*3/uL (ref 0.1–0.9)
Monocytes: 8 %
Neutrophils Absolute: 2.1 10*3/uL (ref 1.4–7.0)
Neutrophils: 54 %
Platelets: 199 10*3/uL (ref 150–450)
RBC: 4.35 x10E6/uL (ref 3.77–5.28)
RDW: 13.2 % (ref 11.7–15.4)
RPR Ser Ql: NONREACTIVE
Rh Factor: POSITIVE
Rubella Antibodies, IGG: 11.1 index (ref >0.99)
WBC: 3.9 10*3/uL (ref 3.4–10.8)

## 2019-07-27 ENCOUNTER — Encounter: Payer: Self-pay | Admitting: Medical

## 2019-07-27 DIAGNOSIS — O99820 Streptococcus B carrier state complicating pregnancy: Secondary | ICD-10-CM | POA: Insufficient documentation

## 2019-07-27 LAB — URINE CULTURE, OB REFLEX

## 2019-07-27 LAB — CULTURE, OB URINE

## 2019-07-27 MED ORDER — AMOXICILLIN 500 MG PO CAPS: 500.0000 mg | ORAL_CAPSULE | Freq: Three times a day (TID) | ORAL | 0 refills | Status: DC

## 2019-07-27 NOTE — Addendum Note (Signed)
Addended by: Marny Lowenstein on: 07/27/2019 01:25 PM   Modules accepted: Orders

## 2019-07-31 ENCOUNTER — Encounter: Payer: Self-pay | Admitting: *Deleted

## 2019-08-06 ENCOUNTER — Telehealth (INDEPENDENT_AMBULATORY_CARE_PROVIDER_SITE_OTHER): Payer: Medicaid Other | Admitting: Lactation Services

## 2019-08-06 DIAGNOSIS — Z3492 Encounter for supervision of normal pregnancy, unspecified, second trimester: Secondary | ICD-10-CM

## 2019-08-06 NOTE — Telephone Encounter (Signed)
Called pt to inform her she is a Silent Carrier for [employer] on [employer]. Pt did not answer.  LM for pt to call the office to discuss.

## 2019-08-07 ENCOUNTER — Encounter: Payer: Self-pay | Admitting: Medical

## 2019-08-07 ENCOUNTER — Encounter: Payer: Self-pay | Admitting: General Practice

## 2019-08-07 DIAGNOSIS — D563 Thalassemia minor: Secondary | ICD-10-CM | POA: Insufficient documentation

## 2019-08-07 NOTE — Telephone Encounter (Signed)
Called pt to inform her of [facility] results. Informed pt that she is a Silent Carrier for Alpha Thalassemia. Recommended that pt call [employer] at [phone] to set up a Genetic Counseling session to discuss. Discussed they will most likely recommend that [name] is also tested. Pt voiced understanding.

## 2019-08-21 ENCOUNTER — Other Ambulatory Visit: Payer: Self-pay

## 2019-08-21 ENCOUNTER — Encounter: Payer: Self-pay | Admitting: Medical

## 2019-08-21 ENCOUNTER — Telehealth (INDEPENDENT_AMBULATORY_CARE_PROVIDER_SITE_OTHER): Payer: Medicaid Other | Admitting: Medical

## 2019-08-21 VITALS — BP 107/68 | HR 87

## 2019-08-21 DIAGNOSIS — D563 Thalassemia minor: Secondary | ICD-10-CM | POA: Diagnosis not present

## 2019-08-21 DIAGNOSIS — O99820 Streptococcus B carrier state complicating pregnancy: Secondary | ICD-10-CM

## 2019-08-21 DIAGNOSIS — N949 Unspecified condition associated with female genital organs and menstrual cycle: Secondary | ICD-10-CM | POA: Diagnosis not present

## 2019-08-21 DIAGNOSIS — Z3A16 16 weeks gestation of pregnancy: Secondary | ICD-10-CM

## 2019-08-21 DIAGNOSIS — Z3492 Encounter for supervision of normal pregnancy, unspecified, second trimester: Secondary | ICD-10-CM

## 2019-08-21 NOTE — Patient Instructions (Signed)

## 2019-08-21 NOTE — Progress Notes (Signed)
I connected with Anne Young on 08/21/19 at  8:35 AM EST by: MyChart and verified that I am speaking with the correct person using two identifiers.  Patient is located at home and provider is located at [facility].     The purpose of this virtual visit is to provide medical care while limiting exposure to the novel coronavirus. I discussed the limitations, risks, security and privacy concerns of performing an evaluation and management service by MyChart and the availability of in person appointments. I also discussed with the patient that there may be a patient responsible charge related to this service. By engaging in this virtual visit, you consent to the provision of healthcare.  Additionally, you authorize for your insurance to be billed for the services provided during this visit.  The patient expressed understanding and agreed to proceed.  The following staff members participated in the virtual visit:  Anne Young, CMA    PRENATAL VISIT NOTE  Subjective:  Anne Young is a 29 y.o. G2P0010 at [redacted]w[redacted]d  for phone visit for ongoing prenatal care.  She is currently monitored for the following issues for this low-risk pregnancy and has Supervision of low-risk pregnancy; Group B streptococcal carriage complicating pregnancy; and Alpha thalassemia silent carrier on their problem list.  Patient reports round ligament pain.  Contractions: Not present. Vag. Bleeding: None.  Movement: Absent. Denies leaking of fluid.   The following portions of the patient's history were reviewed and updated as appropriate: allergies, current medications, past family history, past medical history, past social history, past surgical history and problem list.   Objective:   Vitals:   08/21/19 0848  BP: 107/68  Pulse: 87   Self-Obtained  Fetal Status:     Movement: Absent     Assessment and Plan:  Pregnancy: G2P0010 at [redacted]w[redacted]d 1. Encounter for supervision of low-risk pregnancy in second trimester - Doing well - N/V has  significantly improved  - AFP future order placed for date of [place] appointment   2. Group B streptococcal carriage complicating pregnancy - Needs treatment in labor  3. Alpha thalassemia silent carrier - Information for [employer] given. Patient will call for appointment   4. Round ligament pain - Discussed abdominal binder, hydrotherapy and tylenol PRN for pain   Preterm labor symptoms and general obstetric precautions including but not limited to vaginal bleeding, contractions, leaking of fluid and fetal movement were reviewed in detail with the patient.  Return in about 4 weeks (around 09/18/2019) for LOB, Virtual.  Future Appointments  Date Time Provider Department Center  09/10/2019  9:30 AM [facility] [place] 1 [facility]-M[facility]US M[facility]-US  09/18/2019  1:15 PM Armando Reichert, CNM [facility]    Time spent on virtual visit: 10 minutes  Vonzella Nipple, PA-C

## 2019-08-21 NOTE — Progress Notes (Signed)
I connected with  [name] on 08/21/19 at  8:35 AM EST by telephone and verified that I am speaking with the correct person using two identifiers.   I discussed the limitations, risks, security and privacy concerns of performing an evaluation and management service by telephone and the availability of in person appointments. I also discussed with the patient that there may be a patient responsible charge related to this service. The patient expressed understanding and agreed to proceed.  Ernestina Patches, CMA 08/21/2019  8:49 AM

## 2019-08-23 ENCOUNTER — Telehealth: Payer: Self-pay

## 2019-08-23 NOTE — Telephone Encounter (Signed)
Left message stating that I am calling to inform her that we have her BP cuff at the office if she could please call the office to come pick up or respond to MyChart message.

## 2019-08-26 NOTE — Progress Notes (Signed)
Chart reviewed for nurse visit. Agree with plan of care.   Kooistra, Kathryn Lorraine, CNM 08/26/2019 7:48 AM   

## 2019-09-05 ENCOUNTER — Encounter: Payer: Self-pay | Admitting: General Practice

## 2019-09-10 ENCOUNTER — Other Ambulatory Visit: Payer: Self-pay | Admitting: Student

## 2019-09-10 ENCOUNTER — Other Ambulatory Visit: Payer: Self-pay

## 2019-09-10 ENCOUNTER — Other Ambulatory Visit: Payer: Medicaid Other

## 2019-09-10 ENCOUNTER — Ambulatory Visit (HOSPITAL_COMMUNITY)
Admission: RE | Admit: 2019-09-10 | Discharge: 2019-09-10 | Disposition: A | Discharge status: Home / Self Care | Payer: Medicaid Other | Source: Ambulatory Visit | Attending: Obstetrics and Gynecology | Admitting: Obstetrics and Gynecology

## 2019-09-10 DIAGNOSIS — Z3A19 19 weeks gestation of pregnancy: Secondary | ICD-10-CM | POA: Diagnosis not present

## 2019-09-10 DIAGNOSIS — Z3490 Encounter for supervision of normal pregnancy, unspecified, unspecified trimester: Secondary | ICD-10-CM

## 2019-09-10 DIAGNOSIS — Z363 Encounter for antenatal screening for malformations: Secondary | ICD-10-CM

## 2019-09-10 DIAGNOSIS — Z3492 Encounter for supervision of normal pregnancy, unspecified, second trimester: Secondary | ICD-10-CM

## 2019-09-10 DIAGNOSIS — Z148 Genetic carrier of other disease: Secondary | ICD-10-CM | POA: Diagnosis not present

## 2019-09-12 LAB — AFP, SERUM, OPEN SPINA BIFIDA
AFP MoM: 0.85
AFP Value: 46.1 ng/mL
Gest. Age on Collection Date: 19 weeks
Maternal Age At EDD: 30 yr
OSBR Risk 1 IN: 10000
Test Results:: NEGATIVE
Weight: 151 [lb_av]

## 2019-09-18 ENCOUNTER — Other Ambulatory Visit: Payer: Self-pay

## 2019-09-18 ENCOUNTER — Encounter: Payer: Self-pay | Admitting: Advanced Practice Midwife

## 2019-09-18 ENCOUNTER — Telehealth (INDEPENDENT_AMBULATORY_CARE_PROVIDER_SITE_OTHER): Payer: Medicaid Other | Admitting: Advanced Practice Midwife

## 2019-09-18 DIAGNOSIS — Z3A20 20 weeks gestation of pregnancy: Secondary | ICD-10-CM | POA: Diagnosis not present

## 2019-09-18 DIAGNOSIS — Z3492 Encounter for supervision of normal pregnancy, unspecified, second trimester: Secondary | ICD-10-CM

## 2019-09-18 MED ORDER — BOOST MAX PROTEIN PO LIQD: 1.0000 | Freq: Two times a day (BID) | ORAL | 3 refills | Status: DC

## 2019-09-18 NOTE — Progress Notes (Signed)
   TELEHEALTH VIRTUAL OBSTETRICS VISIT ENCOUNTER NOTE  I connected with Anne Young on 09/18/19 at  1:15 PM EDT by telephone at home and verified that I am speaking with the correct person using two identifiers.   I discussed the limitations, risks, security and privacy concerns of performing an evaluation and management service by telephone and the availability of in person appointments. I also discussed with the patient that there may be a patient responsible charge related to this service. The patient expressed understanding and agreed to proceed.  Subjective:  Anne Young is a 29 y.o. G2P0010 at [redacted]w[redacted]d being followed for ongoing prenatal care.  She is currently monitored for the following issues for this low-risk pregnancy and has Supervision of low-risk pregnancy; Group B streptococcal carriage complicating pregnancy; and Alpha thalassemia silent carrier on their problem list.  Patient reports ringing in left ear . Reports fetal movement. Denies any contractions, bleeding or leaking of fluid.   The following portions of the patient's history were reviewed and updated as appropriate: allergies, current medications, past family history, past medical history, past social history, past surgical history and problem list.   Objective:   General:  Alert, oriented and cooperative.   Mental Status: Normal mood and affect perceived. Normal judgment and thought content.  Rest of physical exam deferred due to type of encounter  Assessment and Plan:  Pregnancy: G2P0010 at [redacted]w[redacted]d 1. Encounter for supervision of low-risk pregnancy in second trimester - Patient and [name] would like to have partner testing since patient is silent carrier for Alpha Thalassemia  - Patient also needs RX for boost, sent to pharmacy   Preterm labor symptoms and general obstetric precautions including but not limited to vaginal bleeding, contractions, leaking of fluid and fetal movement were reviewed in detail with the patient.    I discussed the assessment and treatment plan with the patient. The patient was provided an opportunity to ask questions and all were answered. The patient agreed with the plan and demonstrated an understanding of the instructions. The patient was advised to call back or seek an in-person office evaluation/go to MAU at [facility] for any urgent or concerning symptoms. Please refer to After Visit Summary for other counseling recommendations.   I provided 21 minutes of non-face-to-face time during this encounter.  Return in about 4 weeks (around 10/16/2019) for virtual visit .  No future appointments.  Thressa Sheller DNP, CNM  09/18/19  1:21 PM  Center for [employer], [facility]

## 2019-09-18 NOTE — Progress Notes (Signed)
I connected with  Anne Young on 09/18/19 at  1:15 PM EDT by telephone and verified that I am speaking with the correct person using two identifiers.   I discussed the limitations, risks, security and privacy concerns of performing an evaluation and management service by telephone and the availability of in person appointments. I also discussed with the patient that there may be a patient responsible charge related to this service. The patient expressed understanding and agreed to proceed.  Henrietta Dine, CMA 09/18/2019  1:09 PM

## 2019-09-19 ENCOUNTER — Encounter: Payer: Self-pay | Admitting: *Deleted

## 2019-09-21 ENCOUNTER — Telehealth: Payer: Self-pay | Admitting: *Deleted

## 2019-09-21 DIAGNOSIS — O2610 Low weight gain in pregnancy, unspecified trimester: Secondary | ICD-10-CM | POA: Insufficient documentation

## 2019-09-21 DIAGNOSIS — O210 Mild hyperemesis gravidarum: Secondary | ICD-10-CM

## 2019-09-21 MED ORDER — BOOST MAX PROTEIN PO LIQD: 1.0000 | Freq: Two times a day (BID) | ORAL | 3 refills | Status: DC

## 2019-09-21 NOTE — Telephone Encounter (Signed)
I called [facility] and left a message I was calling about a prescription written the other day- please ready your MyChart message and reply to that or call us back.  I wanted to verify she did not get a copy of RX before she left. If she did not , a new one will need to be written including DX code for poor weight gain, hyperemesis , signed by provider and she will need to pick it up and take to [facility]. Will send another detailed mychart message. Tyianna Menefee,RN  Addendum, visit was virtual. I prepared RX and sent message for her to pick up RX.  Correll Denbow,RN

## 2019-09-21 NOTE — Telephone Encounter (Signed)
-----   Message from Armando Reichert, CNM sent at 09/21/2019  8:29 AM EDT ----- Regarding: RE: nutritional supplements Yes, poor weight gain and hyperemesis  Thank you, Heather  ----- Message ----- From: Gerome Apley, RN Sent: 09/19/2019   4:49 PM EDT To: Armando Reichert, CNM Subject: nutritional supplements                        Heather, I found a RX for nutritional supplements for this patient on printer.  She will need to take it to [facility] if she has WIC. I wanted to clarify if it was for poor weight gain or? Because she will need it signed and with DX code. Just let me know and I can take care of it [facility]

## 2019-10-16 ENCOUNTER — Telehealth (INDEPENDENT_AMBULATORY_CARE_PROVIDER_SITE_OTHER): Payer: Medicaid Other | Admitting: Advanced Practice Midwife

## 2019-10-16 ENCOUNTER — Encounter: Payer: Self-pay | Admitting: Advanced Practice Midwife

## 2019-10-16 DIAGNOSIS — O2610 Low weight gain in pregnancy, unspecified trimester: Secondary | ICD-10-CM

## 2019-10-16 DIAGNOSIS — Z3492 Encounter for supervision of normal pregnancy, unspecified, second trimester: Secondary | ICD-10-CM

## 2019-10-16 DIAGNOSIS — O2612 Low weight gain in pregnancy, second trimester: Secondary | ICD-10-CM | POA: Diagnosis not present

## 2019-10-16 DIAGNOSIS — Z3A24 24 weeks gestation of pregnancy: Secondary | ICD-10-CM | POA: Diagnosis not present

## 2019-10-16 NOTE — Patient Instructions (Signed)
Safe Medications in Pregnancy  ° °Acne: °Benzoyl Peroxide °Salicylic Acid ° °Backache/Headache: °Tylenol: 2 regular strength every 4 hours OR °             2 Extra strength every 6 hours ° °Colds/Coughs/Allergies: °Benadryl (alcohol free) 25 mg every 6 hours as needed °Breath right strips °Claritin °Cepacol throat lozenges °Chloraseptic throat spray °Cold-Eeze- up to three times per day °Cough drops, alcohol free °Flonase  °Guaifenesin °Mucinex °Robitussin DM (plain only, alcohol free) °Saline nasal spray/drops °Sudafed (pseudoephedrine) & Actifed ** use only after [redacted] weeks gestation and if you do not have high blood pressure °Tylenol °Vicks Vaporub °Zinc lozenges °Zyrtec  ° °Constipation: °Colace °Ducolax suppositories °Fleet enema °Glycerin suppositories °Metamucil °Milk of magnesia °Miralax °Senokot °Smooth move tea ° °Diarrhea: °Kaopectate °Imodium A-D ° °*NO pepto Bismol ° °Hemorrhoids: °Anusol °Anusol HC °Preparation H °Tucks ° °Indigestion: °Tums °Maalox °Mylanta °Zantac  °Pepcid ° °Insomnia: °Benadryl (alcohol free) 25mg every 6 hours as needed °Tylenol PM °Unisom, no Gelcaps ° °Leg Cramps: °Tums °MagGel ° °Nausea/Vomiting:  °Bonine °Dramamine °Emetrol °Ginger extract °Sea bands °Meclizine  °Nausea medication to take during pregnancy:  °Unisom (doxylamine succinate 25 mg tablets) Take one tablet daily at bedtime. If symptoms are not adequately controlled, the dose can be increased to a maximum recommended dose of two tablets daily (1/2 tablet in the morning, 1/2 tablet mid-afternoon and one at bedtime). °Vitamin B6 100mg tablets. Take one tablet twice a day (up to 200 mg per day). ° °Skin Rashes: °Aveeno products °Benadryl cream or 25mg every 6 hours as needed °Calamine Lotion °1% cortisone cream ° °Yeast infection: °Gyne-lotrimin 7 °Monistat 7 ° ° °**If taking multiple medications, please check labels to avoid duplicating the same active ingredients °**take medication as directed on the label °** Do not  exceed 4000 mg of tylenol in 24 hours °**Do not take medications that contain aspirin or ibuprofen ° °  °

## 2019-10-16 NOTE — Progress Notes (Signed)
   TELEHEALTH VIRTUAL OBSTETRICS VISIT ENCOUNTER NOTE  I connected with Kathlee Skillin on 10/16/19 at  1:55 PM EDT by telephone at home and verified that I am speaking with the correct person using two identifiers.   I discussed the limitations, risks, security and privacy concerns of performing an evaluation and management service by telephone and the availability of in person appointments. I also discussed with the patient that there may be a patient responsible charge related to this service. The patient expressed understanding and agreed to proceed.  Subjective:  Anne Young is a 29 y.o. G2P0010 at [redacted]w[redacted]d being followed for ongoing prenatal care.  She is currently monitored for the following issues for this low-risk pregnancy and has Supervision of low-risk pregnancy; Group B streptococcal carriage complicating pregnancy; Alpha thalassemia silent carrier; and Poor weight gain of pregnancy, unspecified trimester on their problem list.  Patient reports no complaints. Reports fetal movement. Denies any contractions, bleeding or leaking of fluid.   The following portions of the patient's history were reviewed and updated as appropriate: allergies, current medications, past family history, past medical history, past social history, past surgical history and problem list.   Objective:   General:  Alert, oriented and cooperative.   Mental Status: Normal mood and affect perceived. Normal judgment and thought content.  Rest of physical exam deferred due to type of encounter  Assessment and Plan:  Pregnancy: G2P0010 at [redacted]w[redacted]d 1. Poor weight gain of pregnancy, unspecified trimester  2. Encounter for supervision of low-risk pregnancy in second trimester  - Patient and partner are both silent carrier for alpha-thalassemia. Partner is also carrier for sickle cell disease. Order placed for genetic counseling. RN will call patient to review results and to let them know we will schedule visit with genetic  counselor.    Preterm labor symptoms and general obstetric precautions including but not limited to vaginal bleeding, contractions, leaking of fluid and fetal movement were reviewed in detail with the patient.  I discussed the assessment and treatment plan with the patient. The patient was provided an opportunity to ask questions and all were answered. The patient agreed with the plan and demonstrated an understanding of the instructions. The patient was advised to call back or seek an in-person office evaluation/go to MAU at [facility] for any urgent or concerning symptoms. Please refer to After Visit Summary for other counseling recommendations.   I provided 15 minutes of non-face-to-face time during this encounter.  Return in about 4 weeks (around 11/13/2019) for in person visit with 28 week labs and GTT .  No future appointments.  Thressa Sheller DNP, CNM  10/16/19  2:15 PM  Center for [employer], [facility]

## 2019-11-07 ENCOUNTER — Other Ambulatory Visit: Payer: Self-pay | Admitting: *Deleted

## 2019-11-07 DIAGNOSIS — Z3490 Encounter for supervision of normal pregnancy, unspecified, unspecified trimester: Secondary | ICD-10-CM

## 2019-11-13 ENCOUNTER — Other Ambulatory Visit: Payer: Self-pay

## 2019-11-13 ENCOUNTER — Encounter: Payer: Self-pay | Admitting: Women's Health

## 2019-11-13 ENCOUNTER — Ambulatory Visit (INDEPENDENT_AMBULATORY_CARE_PROVIDER_SITE_OTHER): Payer: Medicaid Other | Admitting: Women's Health

## 2019-11-13 ENCOUNTER — Other Ambulatory Visit: Payer: Medicaid Other

## 2019-11-13 VITALS — BP 113/65 | HR 81 | Wt 160.7 lb

## 2019-11-13 DIAGNOSIS — Z3490 Encounter for supervision of normal pregnancy, unspecified, unspecified trimester: Secondary | ICD-10-CM | POA: Diagnosis not present

## 2019-11-13 DIAGNOSIS — O99820 Streptococcus B carrier state complicating pregnancy: Secondary | ICD-10-CM | POA: Diagnosis not present

## 2019-11-13 DIAGNOSIS — D563 Thalassemia minor: Secondary | ICD-10-CM | POA: Diagnosis not present

## 2019-11-13 DIAGNOSIS — Z3A28 28 weeks gestation of pregnancy: Secondary | ICD-10-CM

## 2019-11-13 DIAGNOSIS — Z23 Encounter for immunization: Secondary | ICD-10-CM

## 2019-11-13 DIAGNOSIS — O2610 Low weight gain in pregnancy, unspecified trimester: Secondary | ICD-10-CM

## 2019-11-13 DIAGNOSIS — Z3493 Encounter for supervision of normal pregnancy, unspecified, third trimester: Secondary | ICD-10-CM

## 2019-11-13 MED ORDER — COMFORT FIT MATERNITY SUPP SM MISC: 1.0000 [IU] | Freq: Every day | 0 refills | Status: DC | PRN

## 2019-11-13 NOTE — Progress Notes (Signed)
Subjective:  Anne Young is a 29 y.o. G2P0010 at [redacted]w[redacted]d being seen today for ongoing prenatal care.  She is currently monitored for the following issues for this low-risk pregnancy and has Supervision of low-risk pregnancy; Group B streptococcal carriage complicating pregnancy; Alpha thalassemia silent carrier; and Poor weight gain of pregnancy, unspecified trimester on their problem list.  Patient reports no complaints.  Contractions: Not present. Vag. Bleeding: None.  Movement: Present. Denies leaking of fluid.   The following portions of the patient's history were reviewed and updated as appropriate: allergies, current medications, past family history, past medical history, past social history, past surgical history and problem list. Problem list updated.  Objective:   Vitals:   11/13/19 0910  BP: 113/65  Pulse: 81  Weight: 160 lb 11.2 oz (72.9 kg)    Fetal Status: Fetal Heart Rate (bpm): 139 Fundal Height: 29 cm Movement: Present     General:  Alert, oriented and cooperative. Patient is in no acute distress.  Skin: Skin is warm and dry. No rash noted.   Cardiovascular: Normal heart rate noted  Respiratory: Normal respiratory effort, no problems with respiration noted  Abdomen: Soft, gravid, appropriate for gestational age. Pain/Pressure: Present     Pelvic: Vag. Bleeding: None     Cervical exam deferred        Extremities: Normal range of motion.  Edema: None  Mental Status: Normal mood and affect. Normal behavior. Normal judgment and thought content.   Urinalysis:      Assessment and Plan:  Pregnancy: G2P0010 at [redacted]w[redacted]d  1. Encounter for supervision of low-risk pregnancy in third trimester -Tdap today -GTT today -RX pregnancy support belt  2. Group B streptococcal carriage complicating pregnancy -+GBS in urine at NOB, will treat in labor  3. Alpha thalassemia silent carrier -Patient and partner are both silent carrier for alpha-thalassemia. Partner is also carrier for  sickle cell disease. Order placed for genetic counseling at last visit. -pt reports appt scheduled 11/21/2019 for genetic counseling through [facility]  4. Poor weight gain of pregnancy, unspecified trimester -weight gain to date -4lb -pt denies food insecurity, but reports no appetite, denies N/V except occasionally, patient is drinking Boost -nutrition consult ordered -growth [place] ordered   Preterm labor symptoms and general obstetric precautions including but not limited to vaginal bleeding, contractions, leaking of fluid and fetal movement were reviewed in detail with the patient. Please refer to After Visit Summary for other counseling recommendations.  Return in about 2 weeks (around 11/27/2019) for virtual LOB/APP OK, needs nutrition consult ASAP, needs growth scan ASAP.   Anne Young, Anne Sera, NP

## 2019-11-13 NOTE — Addendum Note (Signed)
Addended by: Faythe Casa on: 11/13/2019 12:38 PM   Modules accepted: Orders

## 2019-11-13 NOTE — Patient Instructions (Addendum)
PREGNANCY SUPPORT BELT: You are not alone, Seventy-five percent of women have some sort of abdominal or back pain at some point in their pregnancy. Your baby is growing at a fast pace, which means that your whole body is rapidly trying to adjust to the changes. As your uterus grows, your back may start feeling a bit under stress and this can result in back or abdominal pain that can go from mild, and therefore bearable, to severe pains that will not allow you to sit or lay down comfortably, When it comes to dealing with pregnancy-related pains and cramps, some pregnant women usually prefer natural remedies, which the market is filled with nowadays. For example, wearing a pregnancy support belt can help ease and lessen your discomfort and pain. WHAT ARE THE BENEFITS OF WEARING A PREGNANCY SUPPORT BELT? A pregnancy support belt provides support to the lower portion of the belly taking some of the weight of the growing uterus and distributing to the other parts of your body. It is designed make you comfortable and gives you extra support. Over the years, the pregnancy apparel market has been studying the needs and wants of pregnant women and they have come up with the most comfortable pregnancy support belts that woman could ever ask for. In fact, you will no longer have to wear a stretched-out or bulky pregnancy belt that is visible underneath your clothes and makes you feel even more uncomfortable. Nowadays, a pregnancy support belt is made of comfortable and stretchy materials that will not irritate your skin but will actually make you feel at ease and you will not even notice you are wearing it. They are easy to put on and adjust during the day and can be worn at night for additional support.  BENEFITS: . Relives Back pain . Relieves Abdominal Muscle and Leg Pain . Stabilizes the Pelvic Ring . Offers a Cushioned Abdominal Lift Pad . Relieves pressure on the Sciatic Nerve Within Minutes WHERE TO GET  YOUR PREGNANCY BELT: [employer] [phone] [id number]  [place], [place] [address]    Maternity Assessment Unit (MAU)  The Maternity Assessment Unit (MAU) is located at the [facility] and [place] at [facility]. The address is: [address], [place], [place], [place] [address]. Please see map below for additional directions.    The Maternity Assessment Unit is designed to help you during your pregnancy, and for up to 6 weeks after delivery, with any pregnancy- or postpartum-related emergencies, if you think you are in labor, or if your water has broken. For example, if you experience nausea and vomiting, vaginal bleeding, severe abdominal or pelvic pain, elevated blood pressure or other problems related to your pregnancy or postpartum time, please come to the Maternity Assessment Unit for assistance.      Preterm Labor and Birth Information  The normal length of a pregnancy is 39-41 weeks. Preterm labor is when labor starts before 37 completed weeks of pregnancy. What are the risk factors for preterm labor? Preterm labor is more likely to occur in women who:  Have certain infections during pregnancy such as a bladder infection, sexually transmitted infection, or infection inside the uterus (chorioamnionitis).  Have a shorter-than-normal cervix.  Have gone into preterm labor before.  Have had surgery on their cervix.  Are younger than age 61 or older than age 67.  Are African American.  Are pregnant with twins or multiple babies (multiple gestation).  Take street drugs or smoke  while pregnant.  Do not gain enough weight while pregnant.  Became pregnant shortly after having been pregnant. What are the symptoms of preterm labor? Symptoms of preterm labor include:  Cramps similar to those that can happen during a menstrual period. The cramps may happen with diarrhea.  Pain in the abdomen or lower back.  Regular uterine  contractions that may feel like tightening of the abdomen.  A feeling of increased pressure in the pelvis.  Increased watery or bloody mucus discharge from the vagina.  Water breaking (ruptured amniotic sac). Why is it important to recognize signs of preterm labor? It is important to recognize signs of preterm labor because babies who are born prematurely may not be fully developed. This can put them at an increased risk for:  Long-term (chronic) heart and lung problems.  Difficulty immediately after birth with regulating body systems, including blood sugar, body temperature, heart rate, and breathing rate.  Bleeding in the brain.  Cerebral palsy.  Learning difficulties.  Death. These risks are highest for babies who are born before 34 weeks of pregnancy. How is preterm labor treated? Treatment depends on the length of your pregnancy, your condition, and the health of your baby. It may involve:  Having a stitch (suture) placed in your cervix to prevent your cervix from opening too early (cerclage).  Taking or being given medicines, such as: ? Hormone medicines. These may be given early in pregnancy to help support the pregnancy. ? Medicine to stop contractions. ? Medicines to help mature the baby's lungs. These may be prescribed if the risk of delivery is high. ? Medicines to prevent your baby from developing cerebral palsy. If the labor happens before 34 weeks of pregnancy, you may need to stay in the hospital. What should I do if I think I am in preterm labor? If you think that you are going into preterm labor, call your health care provider right away. How can I prevent preterm labor in future pregnancies? To increase your chance of having a full-term pregnancy:  Do not use any tobacco products, such as cigarettes, chewing tobacco, and e-cigarettes. If you need help quitting, ask your health care provider.  Do not use street drugs or medicines that have not been prescribed  to you during your pregnancy.  Talk with your health care provider before taking any herbal supplements, even if you have been taking them regularly.  Make sure you gain a healthy amount of weight during your pregnancy.  Watch for infection. If you think that you might have an infection, get it checked right away.  Make sure to tell your health care provider if you have gone into preterm labor before. This information is not intended to replace advice given to you by your health care provider. Make sure you discuss any questions you have with your health care provider. Document Revised: 10/06/2018 Document Reviewed: 11/05/2015 Elsevier Patient Education  2020 Elsevier Inc.       Glucose Tolerance Test During Pregnancy Why am I having this test? The glucose tolerance test (GTT) is done to check how your body processes sugar (glucose). This is one of several tests used to diagnose diabetes that develops during pregnancy (gestational diabetes mellitus). Gestational diabetes is a temporary form of diabetes that some women develop during pregnancy. It usually occurs during the second trimester of pregnancy and goes away after delivery. Testing (screening) for gestational diabetes usually occurs between 24 and 28 weeks of pregnancy. You may have the GTT test after  having a 1-hour glucose screening test if the results from that test indicate that you may have gestational diabetes. You may also have this test if:  You have a history of gestational diabetes.  You have a history of giving birth to very large babies or have experienced repeated fetal loss (stillbirth).  You have signs and symptoms of diabetes, such as: ? Changes in your vision. ? Tingling or numbness in your hands or feet. ? Changes in hunger, thirst, and urination that are not otherwise explained by your pregnancy. What is being tested? This test measures the amount of glucose in your blood at different times during a period  of 3 hours. This indicates how well your body is able to process glucose. What kind of sample is taken?  Blood samples are required for this test. They are usually collected by inserting a needle into a blood vessel. How do I prepare for this test?  For 3 days before your test, eat normally. Have plenty of carbohydrate-rich foods.  Follow instructions from your health care provider about: ? Eating or drinking restrictions on the day of the test. You may be asked to not eat or drink anything other than water (fast) starting 8-10 hours before the test. ? Changing or stopping your regular medicines. Some medicines may interfere with this test. Tell a health care provider about:  All medicines you are taking, including vitamins, herbs, eye drops, creams, and over-the-counter medicines.  Any blood disorders you have.  Any surgeries you have had.  Any medical conditions you have. What happens during the test? First, your blood glucose will be measured. This is referred to as your fasting blood glucose, since you fasted before the test. Then, you will drink a glucose solution that contains a certain amount of glucose. Your blood glucose will be measured again 1, 2, and 3 hours after drinking the solution. This test takes about 3 hours to complete. You will need to stay at the testing location during this time. During the testing period:  Do not eat or drink anything other than the glucose solution.  Do not exercise.  Do not use any products that contain nicotine or tobacco, such as cigarettes and e-cigarettes. If you need help stopping, ask your health care provider. The testing procedure may vary among health care providers and hospitals. How are the results reported? Your results will be reported as milligrams of glucose per deciliter of blood (mg/dL) or millimoles per liter (mmol/L). Your health care provider will compare your results to normal ranges that were established after testing a  large group of people (reference ranges). Reference ranges may vary among labs and hospitals. For this test, common reference ranges are:  Fasting: less than 95-105 mg/dL (1.6-1.0 mmol/L).  1 hour after drinking glucose: less than 180-190 mg/dL (96.0-45.4 mmol/L).  2 hours after drinking glucose: less than 155-165 mg/dL (0.9-8.1 mmol/L).  3 hours after drinking glucose: 140-145 mg/dL (1.9-1.4 mmol/L). What do the results mean? Results within reference ranges are considered normal, meaning that your glucose levels are well-controlled. If two or more of your blood glucose levels are high, you may be diagnosed with gestational diabetes. If only one level is high, your health care provider may suggest repeat testing or other tests to confirm a diagnosis. Talk with your health care provider about what your results mean. Questions to ask your health care provider Ask your health care provider, or the department that is doing the test:  When will my results  be ready?  How will I get my results?  What are my treatment options?  What other tests do I need?  What are my next steps? Summary  The glucose tolerance test (GTT) is one of several tests used to diagnose diabetes that develops during pregnancy (gestational diabetes mellitus). Gestational diabetes is a temporary form of diabetes that some women develop during pregnancy.  You may have the GTT test after having a 1-hour glucose screening test if the results from that test indicate that you may have gestational diabetes. You may also have this test if you have any symptoms or risk factors for gestational diabetes.  Talk with your health care provider about what your results mean. This information is not intended to replace advice given to you by your health care provider. Make sure you discuss any questions you have with your health care provider. Document Revised: 10/05/2018 Document Reviewed: 01/24/2017 Elsevier Patient Education  2020  [employer].       https://www.cdc.gov/vaccines/hcp/vis/vis-statements/tdap.pdf">  Tdap (Tetanus, Diphtheria, Pertussis) Vaccine: What You Need to Know 1. Why get vaccinated? Tdap vaccine can prevent tetanus, diphtheria, and pertussis. Diphtheria and pertussis spread from person to person. Tetanus enters the body through cuts or wounds.  TETANUS (T) causes painful stiffening of the muscles. Tetanus can lead to serious health problems, including being unable to open the mouth, having trouble swallowing and breathing, or death.  DIPHTHERIA (D) can lead to difficulty breathing, heart failure, paralysis, or death.  PERTUSSIS (aP), also known as "whooping cough," can cause uncontrollable, violent coughing which makes it hard to breathe, eat, or drink. Pertussis can be extremely serious in babies and young children, causing pneumonia, convulsions, brain damage, or death. In teens and adults, it can cause weight loss, loss of bladder control, passing out, and rib fractures from severe coughing. 2. Tdap vaccine Tdap is only for children 7 years and older, adolescents, and adults.  Adolescents should receive a single dose of Tdap, preferably at age 79 or 12 years. Pregnant women should get a dose of Tdap during every pregnancy, to protect the newborn from pertussis. Infants are most at risk for severe, life-threatening complications from pertussis. Adults who have never received Tdap should get a dose of Tdap. Also, adults should receive a booster dose every 10 years, or earlier in the case of a severe and dirty wound or burn. Booster doses can be either Tdap or Td (a different vaccine that protects against tetanus and diphtheria but not pertussis). Tdap may be given at the same time as other vaccines. 3. Talk with your health care provider Tell your vaccine provider if the person getting the vaccine:  Has had an allergic reaction after a previous dose of any vaccine that protects against  tetanus, diphtheria, or pertussis, or has any severe, life-threatening allergies.  Has had a coma, decreased level of consciousness, or prolonged seizures within 7 days after a previous dose of any pertussis vaccine (DTP, DTaP, or Tdap).  Has seizures or another nervous system problem.  Has ever had Guillain-Barr Syndrome (also called GBS).  Has had severe pain or swelling after a previous dose of any vaccine that protects against tetanus or diphtheria. In some cases, your health care provider may decide to postpone Tdap vaccination to a future visit.  People with minor illnesses, such as a cold, may be vaccinated. People who are moderately or severely ill should usually wait until they recover before getting Tdap vaccine.  Your health care provider can give you  more information. 4. Risks of a vaccine reaction  Pain, redness, or swelling where the shot was given, mild fever, headache, feeling tired, and nausea, vomiting, diarrhea, or stomachache sometimes happen after Tdap vaccine. People sometimes faint after medical procedures, including vaccination. Tell your provider if you feel dizzy or have vision changes or ringing in the ears.  As with any medicine, there is a very remote chance of a vaccine causing a severe allergic reaction, other serious injury, or death. 5. What if there is a serious problem? An allergic reaction could occur after the vaccinated person leaves the clinic. If you see signs of a severe allergic reaction (hives, swelling of the face and throat, difficulty breathing, a fast heartbeat, dizziness, or weakness), call 9-1-1 and get the person to the nearest hospital. For other signs that concern you, call your health care provider.  Adverse reactions should be reported to the Vaccine Adverse Event Reporting System (VAERS). Your health care provider will usually file this report, or you can do it yourself. Visit the VAERS website at www.vaers.LAgents.no or call 870-473-5689.  VAERS is only for reporting reactions, and VAERS staff do not give medical advice. 6. The National Vaccine Injury Compensation Program The [employer] Vaccine Injury Compensation Program (VICP) is a federal program that was created to compensate people who may have been injured by certain vaccines. Visit the VICP website at SpiritualWord.at or call 251-357-4884 to learn about the program and about filing a claim. There is a time limit to file a claim for compensation. 7. How can I learn more?  Ask your health care provider.  Call your local or state health department.  Contact the Centers for Disease Control and Prevention (CDC): ? Call 847-863-9360 (1-800-CDC-INFO) or ? Visit CDC's website at PicCapture.uy Vaccine Information Statement Tdap (Tetanus, Diphtheria, Pertussis) Vaccine (09/27/2018) This information is not intended to replace advice given to you by your health care provider. Make sure you discuss any questions you have with your health care provider. Document Revised: 10/06/2018 Document Reviewed: 10/09/2018 Elsevier Patient Education  2020 Elsevier Inc.        Group B Streptococcus Infection During Pregnancy Group B Streptococcus (GBS) is a type of bacteria that is often found in healthy people. It is commonly found in the rectum, vagina, and intestines. In people who are healthy and not pregnant, the bacteria rarely cause serious illness or complications. However, women who test positive for GBS during pregnancy can pass the bacteria to the baby during childbirth. This can cause serious infection in the baby after birth. Women with GBS may also have infections during their pregnancy or soon after childbirth. The infections include urinary tract infections (UTIs) or infections of the uterus. GBS also increases a woman's risk of complications during pregnancy, such as early labor or delivery, miscarriage, or stillbirth. Routine testing for GBS is  recommended for all pregnant women. What are the causes? This condition is caused by bacteria called Streptococcus agalactiae. What increases the risk? You may have a higher risk for GBS infection during pregnancy if you had one during a past pregnancy. What are the signs or symptoms? In most cases, GBS infection does not cause symptoms in pregnant women. If symptoms exist, they may include:  Labor that starts before the 37th week of pregnancy.  A UTI or bladder infection. This may cause a fever, frequent urination, or pain and burning during urination.  Fever during labor. There can also be a rapid heartbeat in the mother or baby. Rare but  serious symptoms of a GBS infection in women include:  Blood infection (septicemia). This may cause fever, chills, or confusion.  Lung infection (pneumonia). This may cause fever, chills, cough, rapid breathing, chest pain, or difficulty breathing.  Bone, joint, skin, or soft tissue infection. How is this diagnosed? You may be screened for GBS between week 35 and week 37 of pregnancy. If you have symptoms of preterm labor, you may be screened earlier. This condition is diagnosed based on lab test results from:  A swab of fluid from the vagina and rectum.  A urine sample. How is this treated? This condition is treated with antibiotic medicine. Antibiotic medicine may be given:  To you when you go into labor, or as soon as your water breaks. The medicines will continue until after you give birth. If you are having a cesarean delivery, you do not need antibiotics unless your water has broken.  To your baby, if he or she requires treatment. Your health care provider will check your baby to decide if he or she needs antibiotics to prevent a serious infection. Follow these instructions at home:  Take over-the-counter and prescription medicines only as told by your health care provider.  Take your antibiotic medicine as told by your health care  provider. Do not stop taking the antibiotic even if you start to feel better.  Keep all pre-birth (prenatal) visits and follow-up visits as told by your health care provider. This is important. Contact a health care provider if:  You have pain or burning when you urinate.  You have to urinate more often than usual.  You have a fever or chills.  You develop a bad-smelling vaginal discharge. Get help right away if:  Your water breaks.  You go into labor.  You have severe pain in your abdomen.  You have difficulty breathing.  You have chest pain. These symptoms may represent a serious problem that is an emergency. Do not wait to see if the symptoms will go away. Get medical help right away. Call your local emergency services (911 in the [place] Do not drive yourself to the hospital. Summary  GBS is a type of bacteria that is common in healthy people.  During pregnancy, colonization with GBS can cause serious complications for you or your baby.  Your health care provider will screen you between 35 and 37 weeks of pregnancy to determine if you are colonized with GBS.  If you are colonized with GBS during pregnancy, your health care provider will recommend antibiotics through an IV during labor.  After delivery, your baby will be evaluated for complications related to potential GBS infection and may require antibiotics to prevent a serious infection. This information is not intended to replace advice given to you by your health care provider. Make sure you discuss any questions you have with your health care provider. Document Revised: 01/08/2019 Document Reviewed: 01/08/2019 Elsevier Patient Education  2020 Elsevier Inc.        Thalassemia  Thalassemia is a blood disorder that causes a low level of red blood cells (anemia). This condition is passed from parent to child through abnormal genes (gene mutations). The mutations make it hard for your body to make the protein in  red blood cells (hemoglobin) that carries oxygen from your lungs to the rest of your body. Red blood cells do not live long without hemoglobin. Loss of red blood cells leads to anemia, which is the main symptom of thalassemia. There are two main types of  thalassemia. The type depends on which part of the hemoglobin is affected.  Alpha thalassemia affects the alpha part of the hemoglobin. This is caused by four genes. You could get two from each parent.  Beta thalassemia affects the beta part of the hemoglobin. This is caused by two genes. You could get one from each parent. Thalassemia can be mild or severe. It depends on how many gene mutations you are born with. The more gene mutations you get, the more severe the condition. A person who inherits just one gene will be a carrier of the condition (thalassemia trait). A person with thalassemia trait may not have any symptoms or may have only mild anemia. A person who inherits two or more genes can have thalassemia minor, thalassemia intermedia, or thalassemia major. Thalassemia is a lifelong condition. There is no cure, but treatment can control symptoms and manage the condition. What are the causes? Thalassemia is cause by gene mutations that are passed down through families. What increases the risk? You are more likely to develop this condition if:  You have a family history of thalassemia.  Your ancestors are from [place], [place], [place], [place], [place], or the [place] What are the signs or symptoms? The most common signs and symptoms of thalassemia are the signs and symptoms of anemia. They include:  Weakness.  Tiredness.  Pounding heartbeat.  Dizziness.  Headache.  Leg cramps.  Pale skin.  Confusion.  Shortness of breath. Other signs and symptoms can also occur. You may have:  Yellow eyes or skin, and dark urine (jaundice). The breakdown of red blood cells can cause a yellowing pigment (bilirubin) to build up in  your blood.  Weak bones (osteoporosis) and bone fractures. This is because bones can weaken from the effort of making more hemoglobin.  An enlarged spleen. This can lead to a swollen belly. Your spleen can become enlarged from filtering dead red blood cells.  Frequent, severe infections. This occurs if your spleen and bone marrow become weak. These organs make white blood cells that your body needs to fight infections. How is this diagnosed? Your health care provider may suspect thalassemia based on your signs and symptoms, especially if you have a family history of the condition. This condition may be diagnosed:  In childhood, if you have severe forms of thalassemia. This is because symptoms show early in life.  At birth. In the [place] babies are screened for this condition.  In adulthood, if you have thalassemia trait or thalassemia minor. This happens if symptoms of anemia start or if a routine blood test shows unexplained anemia. Blood tests can confirm a thalassemia diagnosis. Blood tests may show:  Low hemoglobin.  Low iron.  Abnormal hemoglobin.  Thalassemia gene mutations. You may need to see a health care provider who specializes in blood diseases (hematologist). How is this treated? Treatment for this condition depends on the type of thalassemia that you have:  If you have thalassemia trait or thalassemia minor, you may not need treatment. However, you may need treatment if you have thalassemia minor and you develop symptoms during an infection.  If you have thalassemia intermedia, you will have symptoms that require treatment.  If you have major thalassemia, you will have serious symptoms that require regular treatment. Thalassemia treatment may include:  Donated blood (transfusions) to replace red blood cells.  Vitamin B (folic acid) supplements to help produce hemoglobin and red blood cells.  Medicines or injections to remove iron buildup (chelation). This can  happen in people who have frequent transfusions. Iron overload can damage heart, liver, and brain cells.  In the case of severe thalassemia: ? The spleen may need to be removed if it becomes damaged. ? Stem cell or bone marrow transplants may be necessary to transplant cells that can make red blood cells. This may be done if transfusions are not working. Follow these instructions at home: Eating and drinking   Follow instructions from your health care provider about eating or drinking restrictions. You may need to avoid foods or drinks that are high in iron or fortified with iron.  Eat foods that are high in fiber, such as fresh fruits and vegetables, whole grains, and beans. Limit foods that are high in fat and processed sugars, such as fried and sweet foods. Nutrition is important for preventing anemia. Activity  Return to your normal activities as told by your health care provider. Ask your health care provider what activities are safe for you.  Exercise is important for maintaining energy and strong bones. Ask your health care provider what amount and type of exercise is safe for you. General instructions   Take over-the-counter and prescription medicines only as told by your health care provider.  Keep all routine vaccinations and flu shots up to date to reduce your risk of infection.  Wash your hands frequently.  Do your best to avoid sick people, and stay out of crowds during cold and flu seasons.  Meet with a Dentist if you are or may become pregnant. A genetic counselor can explain the risks of passing thalassemia to a child.  Keep all follow-up visits as told by your health care provider. This is important. Contact a health care provider if:  You have signs or symptoms of anemia.  You have a fever or other signs of infection.  Your belly is swollen.  You have jaundice. Get help right away if:  You feel very weak or short of breath. Summary  Thalassemia  is a blood disorder that causes anemia.  Thalassemia can range from mild to severe.  This condition is passed down through families.  There is no cure, but treatment can manage the symptoms and prevent anemia. This information is not intended to replace advice given to you by your health care provider. Make sure you discuss any questions you have with your health care provider. Document Revised: 10/11/2017 Document Reviewed: 10/11/2017 Elsevier Patient Education  2020 [employer].        Healthy Weight Gain During Pregnancy, Adult A certain amount of weight gain during pregnancy is normal and healthy. How much weight you should gain depends on your overall health and a measurement called BMI (body mass index). BMI is an estimate of your body fat based on your height and weight. You can use an Software engineer to figure out your BMI, or you can ask your health care provider to calculate it for you at your next visit. Your recommended pregnancy weight gain is based on your pre-pregnancy BMI. General guidelines for a healthy total weight gain during pregnancy are listed below. If your BMI at or before the start of your pregnancy is:  Less than 18.5 (underweight), you should gain 28-40 lb (13-18 kg).  18.5-24.9 (normal weight), you should gain 25-35 lb (11-16 kg).  25-29.9 (overweight), you should gain 15-25 lb (7-11 kg).  30 or higher (obese), you should gain 11-20 lb (5-9 kg). These ranges vary depending on your individual health. If you are carrying more  than one baby (multiples), it may be safe to gain more weight than these recommendations. If you gain less weight than recommended, that may be safe as long as your baby is growing and developing normally. How can unhealthy weight gain affect me and my baby? Gaining too much weight during pregnancy can lead to pregnancy complications, such as:  A temporary form of diabetes that develops during pregnancy (gestational  diabetes).  High blood pressure during pregnancy and protein in your urine (preeclampsia).  High blood pressure during pregnancy without protein in your urine (gestational hypertension).  Your baby having a high weight at birth, which may: ? Raise your risk of having a more difficult delivery or a surgical delivery (cesarean delivery, or C-section). ? Raise your child's risk of developing obesity during childhood. Not gaining enough weight can be life-threatening for your baby, and it may raise your baby's chances of:  Being born early (preterm).  Growing more slowly than normal during pregnancy (growth restriction).  Having a low weight at birth. What actions can I take to gain a healthy amount of weight during pregnancy? General instructions  Keep track of your weight gain during pregnancy.  Take over-the-counter and prescription medicines only as told by your health care provider. Take all prenatal supplements as directed.  Keep all health care visits during pregnancy (prenatal visits). These visits are a good time to discuss your weight gain. Your health care provider will weigh you at each visit to make sure you are gaining a healthy amount of weight. Nutrition   Eat a balanced, nutrient-rich diet. Eat plenty of: ? Fruits and vegetables, such as berries and broccoli. ? Whole grains, such as millet, barley, whole-wheat breads and cereals, and oatmeal. ? Low-fat dairy products or non-dairy products such as almond milk or rice milk. ? Protein foods, such as lean meat, chicken, eggs, and legumes (such as peas, beans, soybeans, and lentils).  Avoid foods that are fried or have a lot of fat, salt (sodium), or sugar.  Drink enough fluid to keep your urine pale yellow.  Choose healthy snack and drink options when you are at work or on the go: ? Drink water. Avoid soda, sports drinks, and juices that have added sugar. ? Avoid drinks with caffeine, such as coffee and energy  drinks. ? Eat snacks that are high in protein, such as nuts, protein bars, and low-fat yogurt. ? Carry convenient snacks in your purse that do not need refrigeration, such as a pack of trail mix, an apple, or a granola bar.  If you need help improving your diet, work with a health care provider or a diet and nutrition specialist (dietitian). Activity   Exercise regularly, as told by your health care provider. ? If you were active before becoming pregnant, you may be able to continue your regular fitness activities. ? If you were not active before pregnancy, you may gradually build up to exercising for 30 or more minutes on most days of the week. This may include walking, swimming, or yoga.  Ask your health care provider what activities are safe for you. Talk with your health care provider about whether you may need to be excused from certain school or work activities. Where to find more information Learn more about managing your weight gain during pregnancy from:  American Pregnancy Association: www.americanpregnancy.org  [place] Department of Agriculture pregnancy weight gain calculator: https://ball-collins.biz/www.choosemyplate.gov Summary  Too much weight gain during pregnancy can lead to complications for you and your baby.  Find out your pre-pregnancy BMI to determine how much weight gain is healthy for you.  Eat nutritious foods and stay active.  Keep all of your prenatal visits as told by your health care provider. This information is not intended to replace advice given to you by your health care provider. Make sure you discuss any questions you have with your health care provider. Document Revised: 03/07/2019 Document Reviewed: 03/04/2017 Elsevier Patient Education  2020 [employer].        Eating Plan for Pregnant Women While you are pregnant, your body requires additional nutrition to help support your growing baby. You also have a higher need for some vitamins and minerals, such as folic  acid, calcium, iron, and vitamin D. Eating a healthy, well-balanced diet is very important for your health and your baby's health. Your need for extra calories varies for the three 9-month segments of your pregnancy (trimesters). For most women, it is recommended to consume:  150 extra calories a day during the first trimester.  300 extra calories a day during the second trimester.  300 extra calories a day during the third trimester. What are tips for following this plan?   Do not try to lose weight or go on a diet during pregnancy.  Limit your overall intake of foods that have "empty calories." These are foods that have little nutritional value, such as sweets, desserts, candies, and sugar-sweetened beverages.  Eat a variety of foods (especially fruits and vegetables) to get a full range of vitamins and minerals.  Take a prenatal vitamin to help meet your additional vitamin and mineral needs during pregnancy, specifically for folic acid, iron, calcium, and vitamin D.  Remember to stay active. Ask your health care provider what types of exercise and activities are safe for you.  Practice good food safety and cleanliness. Wash your hands before you eat and after you prepare raw meat. Wash all fruits and vegetables well before peeling or eating. Taking these actions can help to prevent food-borne illnesses that can be very dangerous to your baby, such as listeriosis. Ask your health care provider for more information about listeriosis. What does 150 extra calories look like? Healthy options that provide 150 extra calories each day could be any of the following:  6-8 oz (170-230 g) of plain low-fat yogurt with  cup of berries.  1 apple with 2 teaspoons (11 g) of peanut butter.  Cut-up vegetables with  cup (60 g) of hummus.  8 oz (230 mL) or 1 cup of low-fat chocolate milk.  1 stick of string cheese with 1 medium orange.  1 peanut butter and jelly sandwich that is made with one slice  of whole-wheat bread and 1 tsp (5 g) of peanut butter. For 300 extra calories, you could eat two of those healthy options each day. What is a healthy amount of weight to gain? The right amount of weight gain for you is based on your BMI before you became pregnant. If your BMI:  Was less than 18 (underweight), you should gain 28-40 lb (13-18 kg).  Was 18-24.9 (normal), you should gain 25-35 lb (11-16 kg).  Was 25-29.9 (overweight), you should gain 15-25 lb (7-11 kg).  Was 30 or greater (obese), you should gain 11-20 lb (5-9 kg). What if I am having twins or multiples? Generally, if you are carrying twins or multiples:  You may need to eat 300-600 extra calories a day.  The recommended range for total weight gain is 25-54 lb (11-25  kg), depending on your BMI before pregnancy.  Talk with your health care provider to find out about nutritional needs, weight gain, and exercise that is right for you. What foods can I eat?  Fruits All fruits. Eat a variety of colors and types of fruit. Remember to wash your fruits well before peeling or eating. Vegetables All vegetables. Eat a variety of colors and types of vegetables. Remember to wash your vegetables well before peeling or eating. Grains All grains. Choose whole grains, such as whole-wheat bread, oatmeal, or brown rice. Meats and other protein foods Lean meats, including chicken, [place], fish, and lean cuts of beef, veal, or pork. If you eat fish or seafood, choose options that are higher in omega-3 fatty acids and lower in mercury, such as salmon, herring, mussels, trout, sardines, pollock, shrimp, crab, and lobster. Tofu. Tempeh. Beans. Eggs. Peanut butter and other nut butters. Make sure that all meats, poultry, and eggs are cooked to food-safe temperatures or "well-done." Two or more servings of fish are recommended each week in order to get the most benefits from omega-3 fatty acids that are found in seafood. Choose fish that are lower  in mercury. You can find more information online:  PumpkinSearch.com.ee Dairy Pasteurized milk and milk alternatives (such as almond milk). Pasteurized yogurt and pasteurized cheese. Cottage cheese. Sour cream. Beverages Water. Juices that contain 100% fruit juice or vegetable juice. Caffeine-free teas and decaffeinated coffee. Drinks that contain caffeine are okay to drink, but it is better to avoid caffeine. Keep your total caffeine intake to less than 200 mg each day (which is 12 oz or 355 mL of coffee, tea, or soda) or the limit as told by your health care provider. Fats and oils Fats and oils are okay to include in moderation. Sweets and desserts Sweets and desserts are okay to include in moderation. Seasoning and other foods All pasteurized condiments. The items listed above may not be a complete list of foods and beverages you can eat. Contact a dietitian for more information. What foods are not recommended? Fruits Unpasteurized fruit juices. Vegetables Raw (unpasteurized) vegetable juices. Meats and other protein foods Lunch meats, bologna, hot dogs, or other deli meats. (If you must eat those meats, reheat them until they are steaming hot.) Refrigerated pat, meat spreads from a meat counter, smoked seafood that is found in the refrigerated section of a store. Raw or undercooked meats, poultry, and eggs. Raw fish, such as sushi or sashimi. Fish that have high mercury content, such as tilefish, shark, swordfish, and king mackerel. To learn more about mercury in fish, talk with your health care provider or look for online resources, such as:  PumpkinSearch.com.ee Dairy Raw (unpasteurized) milk and any foods that have raw milk in them. Soft cheeses, such as feta, queso blanco, queso fresco, Brie, Camembert cheeses, blue-veined cheeses, and Panela cheese (unless it is made with pasteurized milk, which must be stated on the label). Beverages Alcohol. Sugar-sweetened beverages, such as sodas, teas, or  energy drinks. Seasoning and other foods Homemade fermented foods and drinks, such as pickles, sauerkraut, or kombucha drinks. (Store-bought pasteurized versions of these are okay.) Salads that are made in a store or deli, such as ham salad, chicken salad, egg salad, tuna salad, and seafood salad. The items listed above may not be a complete list of foods and beverages you should avoid. Contact a dietitian for more information. Where to find more information To calculate the number of calories you need based on your height,  weight, and activity level, you can use an online calculator such as:  PackageNews.is To calculate how much weight you should gain during pregnancy, you can use an online pregnancy weight gain calculator such as:  http://jones-berg.com/ Summary  While you are pregnant, your body requires additional nutrition to help support your growing baby.  Eat a variety of foods, especially fruits and vegetables to get a full range of vitamins and minerals.  Practice good food safety and cleanliness. Wash your hands before you eat and after you prepare raw meat. Wash all fruits and vegetables well before peeling or eating. Taking these actions can help to prevent food-borne illnesses, such as listeriosis, that can be very dangerous to your baby.  Do not eat raw meat or fish. Do not eat fish that have high mercury content, such as tilefish, shark, swordfish, and king mackerel. Do not eat unpasteurized (raw) dairy.  Take a prenatal vitamin to help meet your additional vitamin and mineral needs during pregnancy, specifically for folic acid, iron, calcium, and vitamin D. This information is not intended to replace advice given to you by your health care provider. Make sure you discuss any questions you have with your health care provider. Document Revised: 11/02/2018 Document Reviewed: 03/11/2017 Elsevier Patient Education  2020 [employer].

## 2019-11-14 ENCOUNTER — Encounter: Payer: Self-pay | Admitting: Women's Health

## 2019-11-14 ENCOUNTER — Encounter: Payer: Self-pay | Admitting: General Practice

## 2019-11-14 ENCOUNTER — Other Ambulatory Visit: Payer: Self-pay | Admitting: Women's Health

## 2019-11-14 DIAGNOSIS — O99119 Other diseases of the blood and blood-forming organs and certain disorders involving the immune mechanism complicating pregnancy, unspecified trimester: Secondary | ICD-10-CM | POA: Insufficient documentation

## 2019-11-14 DIAGNOSIS — O99013 Anemia complicating pregnancy, third trimester: Secondary | ICD-10-CM

## 2019-11-14 DIAGNOSIS — O99019 Anemia complicating pregnancy, unspecified trimester: Secondary | ICD-10-CM | POA: Insufficient documentation

## 2019-11-14 DIAGNOSIS — D696 Thrombocytopenia, unspecified: Secondary | ICD-10-CM | POA: Insufficient documentation

## 2019-11-14 HISTORY — DX: Other diseases of the blood and blood-forming organs and certain disorders involving the immune mechanism complicating pregnancy, unspecified trimester: D69.6

## 2019-11-14 LAB — GLUCOSE TOLERANCE, 2 HOURS W/ 1HR
Glucose, 1 hour: 107 mg/dL (ref 65–179)
Glucose, 2 hour: 94 mg/dL (ref 65–152)
Glucose, Fasting: 73 mg/dL (ref 65–91)

## 2019-11-14 LAB — CBC
Hematocrit: 30.2 % — ABNORMAL LOW (ref 34.0–46.6)
Hemoglobin: 9.9 g/dL — ABNORMAL LOW (ref 11.1–15.9)
MCH: 27.2 pg (ref 26.6–33.0)
MCHC: 32.8 g/dL (ref 31.5–35.7)
MCV: 83 fL (ref 79–97)
Platelets: 141 10*3/uL — ABNORMAL LOW (ref 150–450)
RBC: 3.64 x10E6/uL — ABNORMAL LOW (ref 3.77–5.28)
RDW: 13.2 % (ref 11.7–15.4)
WBC: 6.1 10*3/uL (ref 3.4–10.8)

## 2019-11-14 LAB — RPR: RPR Ser Ql: NONREACTIVE

## 2019-11-14 LAB — HIV ANTIBODY (ROUTINE TESTING W REFLEX): HIV Screen 4th Generation wRfx: NONREACTIVE

## 2019-11-14 MED ORDER — FERROUS SULFATE 325 (65 FE) MG PO TABS: 325.0000 mg | ORAL_TABLET | Freq: Every day | ORAL | 3 refills | Status: DC

## 2019-11-14 NOTE — Progress Notes (Signed)
RX iron.  Marylen Ponto, NP  9:19 AM 11/14/2019

## 2019-11-15 ENCOUNTER — Encounter: Payer: Medicaid Other | Attending: Women's Health | Admitting: Registered"

## 2019-11-15 ENCOUNTER — Ambulatory Visit: Payer: Medicaid Other | Admitting: Registered"

## 2019-11-15 ENCOUNTER — Other Ambulatory Visit: Payer: Self-pay

## 2019-11-15 VITALS — Ht 68.0 in | Wt 161.8 lb

## 2019-11-15 DIAGNOSIS — O2610 Low weight gain in pregnancy, unspecified trimester: Secondary | ICD-10-CM | POA: Diagnosis not present

## 2019-11-15 DIAGNOSIS — Z3A00 Weeks of gestation of pregnancy not specified: Secondary | ICD-10-CM | POA: Diagnosis not present

## 2019-11-15 NOTE — Progress Notes (Signed)
  Medical Nutrition Therapy:  Appt start time: 0945 end time:  1015.   Assessment:  Primary concerns today: poor weight gain for pregnancy, anemia in pregnancy  Patient states she lost weight during first trimester due to daily emesis. Pt states 2nd trimester was not sick, but appetite did not return. Pt reports she usually eats 1 meal per day with Boost High Protein 2x/day. Pt states she is using this product because [facility] approves this variety.  Pt reports she has a Rx for Boost Max Protein but she is not able to purchase it. Pt states she has used the Boost plus (high calorie) but switch to High Protein variety due to [facility].  DIETARY INTAKE: approx 1000 kcal + sweet tea  24-hr recall:  B ( AM): Boost High Protein    240 Snk ( AM):   L ( PM): [employer] High spicy chicken sand 460 Snk ( PM):  D ( PM): Boost High Protein   240 Snk ( PM): trail mix    300 Beverages: water, sweet tea  Usual physical activity: not assessed   Estimated energy needs: ~2800 calories  Progress Towards Goal(s):  New goals.   Nutritional Diagnosis:  NI-1.4 Inadequate energy intake As related to decreased appetite.  As evidenced by poor weight gain for pregnancy and dietary recall.    Intervention:  Nutrition Education topics: Guidelines for weight gain in pregnancy Food groups and recommendation for intake Role of fat for increased caloric intake Snack suggestions to maximize intake  Teaching Method Utilized:  Visual Auditory Hands on  Handouts given during visit include:  MyPlate for Moms  AND Iron MNT   Barriers to learning/adherence to lifestyle change: none  Demonstrated degree of understanding via:  Teach Back   Monitoring/Evaluation:  Dietary intake, exercise, and body weight in 2 weeks.

## 2019-11-15 NOTE — Patient Instructions (Addendum)
Continue having Boost 2x day Include sauces when getting food at restaurants Eat fats from oils, nuts and avocadoes Consider have calorie rich snacks Consider having less sweet tea to leave room for more nutritious snacks Iron: leafy greens, beans, dried fruit, fortified cereal

## 2019-11-21 ENCOUNTER — Other Ambulatory Visit: Payer: Self-pay

## 2019-11-21 ENCOUNTER — Ambulatory Visit: Payer: Medicaid Other | Admitting: *Deleted

## 2019-11-21 ENCOUNTER — Ambulatory Visit: Payer: Medicaid Other | Attending: Women's Health

## 2019-11-21 DIAGNOSIS — O99013 Anemia complicating pregnancy, third trimester: Secondary | ICD-10-CM | POA: Insufficient documentation

## 2019-11-21 DIAGNOSIS — O99119 Other diseases of the blood and blood-forming organs and certain disorders involving the immune mechanism complicating pregnancy, unspecified trimester: Secondary | ICD-10-CM | POA: Insufficient documentation

## 2019-11-21 DIAGNOSIS — O2613 Low weight gain in pregnancy, third trimester: Secondary | ICD-10-CM | POA: Diagnosis not present

## 2019-11-21 DIAGNOSIS — O2610 Low weight gain in pregnancy, unspecified trimester: Secondary | ICD-10-CM

## 2019-11-21 DIAGNOSIS — Z362 Encounter for other antenatal screening follow-up: Secondary | ICD-10-CM | POA: Diagnosis not present

## 2019-11-21 DIAGNOSIS — Z148 Genetic carrier of other disease: Secondary | ICD-10-CM | POA: Diagnosis not present

## 2019-11-21 DIAGNOSIS — Z3493 Encounter for supervision of normal pregnancy, unspecified, third trimester: Secondary | ICD-10-CM

## 2019-11-21 DIAGNOSIS — D696 Thrombocytopenia, unspecified: Secondary | ICD-10-CM | POA: Insufficient documentation

## 2019-11-21 DIAGNOSIS — Z3A30 30 weeks gestation of pregnancy: Secondary | ICD-10-CM

## 2019-11-28 ENCOUNTER — Encounter: Payer: Self-pay | Admitting: *Deleted

## 2019-11-29 ENCOUNTER — Encounter: Payer: Medicaid Other | Attending: Women's Health | Admitting: Registered"

## 2019-11-29 ENCOUNTER — Ambulatory Visit: Payer: Medicaid Other | Admitting: Registered"

## 2019-11-29 ENCOUNTER — Other Ambulatory Visit: Payer: Self-pay

## 2019-11-29 VITALS — Ht 65.0 in | Wt 166.6 lb

## 2019-11-29 DIAGNOSIS — Z3A00 Weeks of gestation of pregnancy not specified: Secondary | ICD-10-CM | POA: Diagnosis not present

## 2019-11-29 DIAGNOSIS — O2610 Low weight gain in pregnancy, unspecified trimester: Secondary | ICD-10-CM | POA: Diagnosis not present

## 2019-11-29 NOTE — Progress Notes (Signed)
Follow-up Medical Nutrition Therapy:  Appt start time: 1315 end time: 1345.   Assessment:  Primary concerns today: poor weight gain for pregnancy, anemia in pregnancy  Patient has gained ~5 lbs in 2 weeks. Weight includes sweater and shoes.  Reason for height changes: At her visit on 11/15/19 patient self-reported height at 5'8". Today on the new scale which also measures height, she was measured at 5'5.25" RD subtracted .25" for her shoes.    Patient states she is eating more often, about every 2 hours. Usually eats one meal around 4 pm and snacks the rest of the day. Pt reports if she eats at night feels like food is coming back up throat, feels like with her stomach getting bigger is not only making it hard to eat much at night, but also is making it difficult to get comfortable for sleeping.    Patient states last week she got sick after drinking Boost and has stopped drinking it for now.  Pt states she continues to have issues with constipation since starting iron supplement. Pt states she drinks plenty of water when it is hot, but does not desire water when it is cooler weather.  DIETARY INTAKE: approx 1800 kcal + sweet tea  24-hr recall:  B ( AM): Bread and hot chocolate Snk ( AM): peanut butter crackers L (4 PM): [employer] spicy chicken sandwich, fries Snk ( PM): [place] and original yogurt, donut D ( PM): cherries    Snk ( PM): ice cream     Beverages: 1-3 bottles of 17 oz water, sweet tea, boost  Usual physical activity: ADLs, may walk at mall for 30 min 1x/week   Estimated energy needs: ~2800 calories  Progress Towards Goal(s):  New goals.   Nutritional Diagnosis:  NI-1.4 Inadequate energy intake As related to decreased appetite.  As evidenced by poor weight gain for pregnancy and dietary recall.    Intervention:  Nutrition Education topics:  Increasing vegetables for vitamins and minerals  Constipation  Plan: Keep snacking often to help keep up healthy weight  gain. Consider eating more beans and green leafy vegetables for iron and folate. To help with with constipation:  Drink plenty of water  Include more fiber such as beans and vegetables  Exercise at least 10 min per day  Teaching Method Utilized:  Visual Auditory Hands on  Handouts given during visit include:  none  Barriers to learning/adherence to lifestyle change: none  Demonstrated degree of understanding via:  Teach Back   Monitoring/Evaluation:  Dietary intake, exercise, and body weight prn.

## 2019-11-29 NOTE — Patient Instructions (Addendum)
Keep snacking often to help keep up healthy weight gain. Consider eating more beans and green leafy vegetables for iron and folate. To help with with constipation:  Drink plenty of water  Include more fiber such as beans and vegetables  Exercise at least 10 min per day

## 2019-12-03 ENCOUNTER — Ambulatory Visit (INDEPENDENT_AMBULATORY_CARE_PROVIDER_SITE_OTHER): Payer: Medicaid Other | Admitting: Nurse Practitioner

## 2019-12-03 ENCOUNTER — Other Ambulatory Visit: Payer: Self-pay

## 2019-12-03 VITALS — BP 118/68 | HR 93 | Wt 164.8 lb

## 2019-12-03 DIAGNOSIS — O2610 Low weight gain in pregnancy, unspecified trimester: Secondary | ICD-10-CM

## 2019-12-03 DIAGNOSIS — Z3A31 31 weeks gestation of pregnancy: Secondary | ICD-10-CM

## 2019-12-03 DIAGNOSIS — O2613 Low weight gain in pregnancy, third trimester: Secondary | ICD-10-CM

## 2019-12-03 DIAGNOSIS — Z3491 Encounter for supervision of normal pregnancy, unspecified, first trimester: Secondary | ICD-10-CM

## 2019-12-03 NOTE — Patient Instructions (Signed)
Childbirth Education Options: [facility] Classes:  Childbirth education classes can help you get ready for a positive parenting experience. You can also meet other expectant parents and get free stuff for your baby. Each class runs for five weeks on the same night and costs $45 for the mother-to-be and her support person. Medicaid covers the cost if you are eligible. Call [phone] to register. [facility] Childbirth Education:  [phone] or [phone] or [email or web address]  Baby & Me Class: Discuss newborn & infant parenting and family adjustment issues with other new mothers in a relaxed environment. Each week brings a new speaker or baby-centered activity. We encourage new mothers to join us every Thursday at 11:00am. Babies birth until crawling. No registration or fee. Daddy Boot Camp: This course offers Dads-to-be the tools and knowledge needed to feel confident on their journey to becoming new fathers. Experienced dads, who have been trained as coaches, teach dads-to-be how to hold, comfort, diaper, swaddle and play with their infant while being able to support the new mom as well. A class for men taught by men. $25/dad Big Brother/Big Sister: Let your children share in the joy of a new brother or sister in this special class designed just for them. Class includes discussion about how families care for babies: swaddling, holding, diapering, safety as well as how they can be helpful in their new role. This class is designed for children ages 2 to 6, but any age is welcome. Please register each child individually. $5/child  Mom Talk: This mom-led group offers support and connection to mothers as they journey through the adjustments and struggles of that sometimes overwhelming first year after the birth of a child. Tuesdays at 10:00am and Thursdays at 6:00pm. Babies welcome. No registration or fee. Breastfeeding Support Group: This group is a mother-to-mother  support circle where moms have the opportunity to share their breastfeeding experiences. A Lactation Consultant is present for questions and concerns. Meets each Tuesday at 11:00am. No fee or registration. Breastfeeding Your Baby: Learn what to expect in the first days of breastfeeding your newborn.  This class will help you feel more confident with the skills needed to begin your breastfeeding experience. Many new mothers are concerned about breastfeeding after leaving the hospital. This class will also address the most common fears and challenges about breastfeeding during the first few weeks, months and beyond. (call for fee) Comfort Techniques and Tour: This 2 hour interactive class will provide you the opportunity to learn & practice hands-on techniques that can help relieve some of the discomfort of labor and encourage your baby to rotate toward the best position for birth. You and your partner will be able to try a variety of labor positions with birth balls and rebozos as well as practice breathing, relaxation, and visualization techniques. A tour of the [facility] is included with this class. $20 per registrant and support person Childbirth Class- Weekend Option: This class is a Weekend version of our Birth & Baby series. It is designed for parents who have a difficult time fitting several weeks of classes into their schedule. It covers the care of your newborn and the basics of labor and childbirth. It also includes a Maternity Care Center Tour of [facility] and lunch. The class is held two consecutive days: beginning on Friday evening from 6:30 - 8:30 p.m. and the next day, Saturday from 9 a.m. - 4 p.m. (call for fee) Waterbirth Class: Interested in a waterbirth?  This   informational class will help you discover whether waterbirth is the right fit for you. Education about waterbirth itself, supplies you would need and how to assemble your support team is what you can  expect from this class. Some obstetrical practices require this class in order to pursue a waterbirth. (Not all obstetrical practices offer waterbirth-check with your healthcare provider.) Register only the expectant mom, but you are encouraged to bring your partner to class! Required if planning waterbirth, no fee. Infant/Child CPR: Parents, grandparents, babysitters, and friends learn Cardio-Pulmonary Resuscitation skills for infants and children. You will also learn how to treat both conscious and unconscious choking in infants and children. This Family & Friends program does not offer certification. Register each participant individually to ensure that enough mannequins are available. (Call for fee) Grandparent Love: Expecting a grandbaby? This class is for you! Learn about the latest infant care and safety recommendations and ways to support your own child as he or she transitions into the parenting role. Taught by Registered Nurses who are childbirth instructors, but most importantly...they are grandmothers too! $10/person. Childbirth Class- Natural Childbirth: This series of 5 weekly classes is for expectant parents who want to learn and practice natural methods of coping with the process of labor and childbirth. Relaxation, breathing, massage, visualization, role of the partner, and helpful positioning are highlighted. Participants learn how to be confident in their body's ability to give birth. This class will empower and help parents make informed decisions about their own care. Includes discussion that will help new parents transition into the immediate postpartum period. Maternity Care Center Tour of [facility] is included. We suggest taking this class between 25-32 weeks, but it's only a recommendation. $75 per registrant and one support person or $30 Medicaid. Childbirth Class- 3 week Series: This option of 3 weekly classes helps you and your labor partner prepare for childbirth. Newborn  care, labor & birth, cesarean birth, pain management, and comfort techniques are discussed and a Maternity Care Center Tour of [facility] is included. The class meets at the same time, on the same day of the week for 3 consecutive weeks beginning with the starting date you choose. $60 for registrant and one support person.  Marvelous Multiples: Expecting twins, triplets, or more? This class covers the differences in labor, birth, parenting, and breastfeeding issues that face multiples' parents. NICU tour is included. Led by a Certified Childbirth Educator who is the mother of twins. No fee. Caring for Baby: This class is for expectant and adoptive parents who want to learn and practice the most up-to-date newborn care for their babies. Focus is on birth through the first six weeks of life. Topics include feeding, bathing, diapering, crying, umbilical cord care, circumcision care and safe sleep. Parents learn to recognize symptoms of illness and when to call the pediatrician. Register only the mom-to-be and your partner or support person can plan to come with you! $10 per registrant and support person Childbirth Class- online option: This online class offers you the freedom to complete a Birth and Baby series in the comfort of your own home. The flexibility of this option allows you to review sections at your own pace, at times convenient to you and your support people. It includes additional video information, animations, quizzes, and extended activities. Get organized with helpful eClass tools, checklists, and trackers. Once you register online for the class, you will receive an email within a few days to accept the invitation and begin the class when the time   is right for you. The content will be available to you for 60 days. $60 for 60 days of online access for you and your support people.         

## 2019-12-03 NOTE — Progress Notes (Signed)
    Subjective:  Anne Young is a 29 y.o. G2P0010 at [redacted]w[redacted]d being seen today for ongoing prenatal care.  She is currently monitored for the following issues for this low-risk pregnancy and has Supervision of low-risk pregnancy; Group B streptococcal carriage complicating pregnancy; Alpha thalassemia silent carrier; Poor weight gain of pregnancy, unspecified trimester; Anemia in pregnancy; and Thrombocytopenia affecting pregnancy (HCC) on their problem list.  Patient reports baby was breech on Ultrasound, no other problems, baby is moving well.  Contractions: Irritability. Vag. Bleeding: None.  Movement: Present. Denies leaking of fluid.   The following portions of the patient's history were reviewed and updated as appropriate: allergies, current medications, past family history, past medical history, past social history, past surgical history and problem list. Problem list updated.  Objective:   Vitals:   12/03/19 1554  BP: 118/68  Pulse: 93  Weight: 164 lb 12.8 oz (74.8 kg)    Fetal Status: Fetal Heart Rate (bpm): 158 Fundal Height: 33 cm Movement: Present     General:  Alert, oriented and cooperative. Patient is in no acute distress.  Skin: Skin is warm and dry. No rash noted.   Cardiovascular: Normal heart rate noted  Respiratory: Normal respiratory effort, no problems with respiration noted  Abdomen: Soft, gravid, appropriate for gestational age. Pain/Pressure: Present     Pelvic:  Cervical exam deferred        Extremities: Normal range of motion.  Edema: None  Mental Status: Normal mood and affect. Normal behavior. Normal judgment and thought content.   Urinalysis:      Assessment and Plan:  Pregnancy: G2P0010 at [redacted]w[redacted]d  1. Encounter for supervision of low-risk pregnancy in first trimester Still feels breech today with leopold maneuvers.  Discussed revisiting at 35-36 weeks and making a plan for management if still breech.  Advised that MD could turn baby while still inside,  version,  to be able to have a vaginal birth.  Baby is moving well and could turn also. No edema Taking BP at home - stopped recording as hers have always been good and has been coming more frequently to the office. Reminded to sign up for childbirth and breastfeeding classes.  2. Poor weight gain of pregnancy, unspecified trimester Doing better since drinking Boost  Preterm labor symptoms and general obstetric precautions including but not limited to vaginal bleeding, contractions, leaking of fluid and fetal movement were reviewed in detail with the patient. Please refer to After Visit Summary for other counseling recommendations.  Return in about 2 weeks (around 12/17/2019) for Virtual ROB.  Nolene Bernheim, RN, MSN, NP-BC Nurse Practitioner, [facility] for [employer], [facility] 12/03/2019 4:21 PM

## 2019-12-05 ENCOUNTER — Other Ambulatory Visit: Payer: Self-pay | Admitting: *Deleted

## 2019-12-05 ENCOUNTER — Other Ambulatory Visit: Payer: Self-pay

## 2019-12-05 DIAGNOSIS — O2610 Low weight gain in pregnancy, unspecified trimester: Secondary | ICD-10-CM

## 2019-12-19 ENCOUNTER — Encounter: Payer: Self-pay | Admitting: Obstetrics and Gynecology

## 2019-12-19 ENCOUNTER — Telehealth (INDEPENDENT_AMBULATORY_CARE_PROVIDER_SITE_OTHER): Payer: Medicaid Other | Admitting: Obstetrics and Gynecology

## 2019-12-19 VITALS — BP 107/63 | HR 78

## 2019-12-19 DIAGNOSIS — Z3A34 34 weeks gestation of pregnancy: Secondary | ICD-10-CM

## 2019-12-19 DIAGNOSIS — D649 Anemia, unspecified: Secondary | ICD-10-CM

## 2019-12-19 DIAGNOSIS — O99013 Anemia complicating pregnancy, third trimester: Secondary | ICD-10-CM

## 2019-12-19 DIAGNOSIS — Z3493 Encounter for supervision of normal pregnancy, unspecified, third trimester: Secondary | ICD-10-CM

## 2019-12-19 NOTE — Progress Notes (Signed)
   MY CHART VIDEO VIRTUAL OBSTETRICS VISIT ENCOUNTER NOTE  I connected with Keyona Cornacchia on 12/19/19 at  3:15 PM EDT by My Chart video at home and verified that I am speaking with the correct person using two identifiers. Provider located at [facility] for [employer] at [employer] [facility].   I discussed the limitations, risks, security and privacy concerns of performing an evaluation and management service by My Chart video and the availability of in person appointments. I also discussed with the patient that there may be a patient responsible charge related to this service. The patient expressed understanding and agreed to proceed.  Subjective:  Anne Young is a 29 y.o. G2P0010 at [redacted]w[redacted]d being followed for ongoing prenatal care.  She is currently monitored for the following issues for this low-risk pregnancy and has Supervision of low-risk pregnancy; Group B streptococcal carriage complicating pregnancy; Alpha thalassemia silent carrier; Poor weight gain of pregnancy, unspecified trimester; Anemia in pregnancy; and Thrombocytopenia affecting pregnancy (HCC) on their problem list.  Patient reports increased lower pelvic pressure with changing positions. She reports that her baby was in a breech position on her most recent U/S. She would like to have the baby's position checked. Reports fetal movement. Denies any contractions, bleeding or leaking of fluid.   The following portions of the patient's history were reviewed and updated as appropriate: allergies, current medications, past family history, past medical history, past social history, past surgical history and problem list.   Objective:   General:  Alert, oriented and cooperative.   Mental Status: Normal mood and affect perceived. Normal judgment and thought content.  Rest of physical exam deferred due to type of encounter  BP 107/63   Pulse 78   LMP 04/30/2019 (Exact Date)  **Done by patient's own at home BP cuff and scale  Assessment  and Plan:  Pregnancy: G2P0010 at [redacted]w[redacted]d 1. Encounter for supervision of low-risk pregnancy in third trimester - Discussed no need for GBS swab next visit d/t (+) GBS bacteriuria  - Provider will assess fetal position with Leopold's maneuvers, if unable to determine position an U/S can be ordered at that time. Discussed ECV as an option for a breech baby.  Preterm labor symptoms and general obstetric precautions including but not limited to vaginal bleeding, contractions, leaking of fluid and fetal movement were reviewed in detail with the patient.  I discussed the assessment and treatment plan with the patient. The patient was provided an opportunity to ask questions and all were answered. The patient agreed with the plan and demonstrated an understanding of the instructions. The patient was advised to call back or seek an in-person office evaluation/go to [facility] at [facility] for any urgent or concerning symptoms. Please refer to After Visit Summary for other counseling recommendations.   I provided 5 minutes of non-face-to-face time during this encounter. There was 5 minutes of chart review time spent prior to this encounter. Total time spent = 10 minutes.  Return in about 2 weeks (around 01/02/2020) for Return OB visit - check presentation.  No future appointments.  Raelyn Mora, CNM Center for [employer], [facility]

## 2019-12-19 NOTE — Progress Notes (Signed)
I connected with  [name] on 12/19/19 at  3:15 PM EDT by telephone and verified that I am speaking with the correct person using two identifiers.   I discussed the limitations, risks, security and privacy concerns of performing an evaluation and management service by telephone and the availability of in person appointments. I also discussed with the patient that there may be a patient responsible charge related to this service. The patient expressed understanding and agreed to proceed.  Ernestina Patches, CMA 12/19/2019  3:38 PM

## 2019-12-19 NOTE — Patient Instructions (Signed)
Group B Streptococcus Infection During Pregnancy °Group B Streptococcus (GBS) is a type of bacteria that is often found in healthy people. It is commonly found in the rectum, vagina, and intestines. In people who are healthy and not pregnant, the bacteria rarely cause serious illness or complications. However, women who test positive for GBS during pregnancy can pass the bacteria to the baby during childbirth. This can cause serious infection in the baby after birth. °Women with GBS may also have infections during their pregnancy or soon after childbirth. The infections include urinary tract infections (UTIs) or infections of the uterus. GBS also increases a woman's risk of complications during pregnancy, such as early labor or delivery, miscarriage, or stillbirth. Routine testing for GBS is recommended for all pregnant women. °What are the causes? °This condition is caused by bacteria called Streptococcus agalactiae. °What increases the risk? °You may have a higher risk for GBS infection during pregnancy if you had one during a past pregnancy. °What are the signs or symptoms? °In most cases, GBS infection does not cause symptoms in pregnant women. If symptoms exist, they may include: °· Labor that starts before the 37th week of pregnancy. °· A UTI or bladder infection. This may cause a fever, frequent urination, or pain and burning during urination. °· Fever during labor. There can also be a rapid heartbeat in the mother or baby. °Rare but serious symptoms of a GBS infection in women include: °· Blood infection (septicemia). This may cause fever, chills, or confusion. °· Lung infection (pneumonia). This may cause fever, chills, cough, rapid breathing, chest pain, or difficulty breathing. °· Bone, joint, skin, or soft tissue infection. °How is this diagnosed? °You may be screened for GBS between week 35 and week 37 of pregnancy. If you have symptoms of preterm labor, you may be screened earlier. This condition is  diagnosed based on lab test results from: °· A swab of fluid from the vagina and rectum. °· A urine sample. °How is this treated? °This condition is treated with antibiotic medicine. Antibiotic medicine may be given: °· To you when you go into labor, or as soon as your water breaks. The medicines will continue until after you give birth. If you are having a cesarean delivery, you do not need antibiotics unless your water has broken. °· To your baby, if he or she requires treatment. Your health care provider will check your baby to decide if he or she needs antibiotics to prevent a serious infection. °Follow these instructions at home: °· Take over-the-counter and prescription medicines only as told by your health care provider. °· Take your antibiotic medicine as told by your health care provider. Do not stop taking the antibiotic even if you start to feel better. °· Keep all pre-birth (prenatal) visits and follow-up visits as told by your health care provider. This is important. °Contact a health care provider if: °· You have pain or burning when you urinate. °· You have to urinate more often than usual. °· You have a fever or chills. °· You develop a bad-smelling vaginal discharge. °Get help right away if: °· Your water breaks. °· You go into labor. °· You have severe pain in your abdomen. °· You have difficulty breathing. °· You have chest pain. °These symptoms may represent a serious problem that is an emergency. Do not wait to see if the symptoms will go away. Get medical help right away. Call your local emergency services (911 in the [place] Do not drive yourself to   the hospital. °Summary °· GBS is a type of bacteria that is common in healthy people. °· During pregnancy, colonization with GBS can cause serious complications for you or your baby. °· Your health care provider will screen you between 35 and 37 weeks of pregnancy to determine if you are colonized with GBS. °· If you are colonized with GBS during  pregnancy, your health care provider will recommend antibiotics through an IV during labor. °· After delivery, your baby will be evaluated for complications related to potential GBS infection and may require antibiotics to prevent a serious infection. °This information is not intended to replace advice given to you by your health care provider. Make sure you discuss any questions you have with your health care provider. °Document Revised: 01/08/2019 Document Reviewed: 01/08/2019 °Elsevier Patient Education © 2020 [employer] Inc[employer] ° °

## 2019-12-25 DIAGNOSIS — O339 Maternal care for disproportion, unspecified: Secondary | ICD-10-CM | POA: Diagnosis not present

## 2020-01-03 ENCOUNTER — Other Ambulatory Visit (HOSPITAL_COMMUNITY)
Admission: RE | Admit: 2020-01-03 | Discharge: 2020-01-03 | Disposition: A | Discharge status: Home / Self Care | Payer: Medicaid Other | Source: Ambulatory Visit | Attending: Medical | Admitting: Medical

## 2020-01-03 ENCOUNTER — Ambulatory Visit (INDEPENDENT_AMBULATORY_CARE_PROVIDER_SITE_OTHER): Payer: Medicaid Other | Admitting: Medical

## 2020-01-03 ENCOUNTER — Encounter: Payer: Self-pay | Admitting: Medical

## 2020-01-03 ENCOUNTER — Other Ambulatory Visit: Payer: Self-pay

## 2020-01-03 VITALS — BP 105/66 | HR 96 | Wt 173.6 lb

## 2020-01-03 DIAGNOSIS — D563 Thalassemia minor: Secondary | ICD-10-CM

## 2020-01-03 DIAGNOSIS — A749 Chlamydial infection, unspecified: Secondary | ICD-10-CM

## 2020-01-03 DIAGNOSIS — O0993 Supervision of high risk pregnancy, unspecified, third trimester: Secondary | ICD-10-CM

## 2020-01-03 DIAGNOSIS — O99820 Streptococcus B carrier state complicating pregnancy: Secondary | ICD-10-CM

## 2020-01-03 DIAGNOSIS — O98813 Other maternal infectious and parasitic diseases complicating pregnancy, third trimester: Secondary | ICD-10-CM

## 2020-01-03 DIAGNOSIS — O99113 Other diseases of the blood and blood-forming organs and certain disorders involving the immune mechanism complicating pregnancy, third trimester: Secondary | ICD-10-CM

## 2020-01-03 DIAGNOSIS — D696 Thrombocytopenia, unspecified: Secondary | ICD-10-CM

## 2020-01-03 DIAGNOSIS — D649 Anemia, unspecified: Secondary | ICD-10-CM

## 2020-01-03 DIAGNOSIS — O99013 Anemia complicating pregnancy, third trimester: Secondary | ICD-10-CM

## 2020-01-03 DIAGNOSIS — Z3A36 36 weeks gestation of pregnancy: Secondary | ICD-10-CM

## 2020-01-03 NOTE — Progress Notes (Signed)
   PRENATAL VISIT NOTE  Subjective:  Anne Young is a 29 y.o. G2P0010 at [redacted]w[redacted]d being seen today for ongoing prenatal care.  She is currently monitored for the following issues for this high-risk pregnancy and has Supervision of high risk pregnancy, antepartum, third trimester; Group B streptococcal carriage complicating pregnancy; Alpha thalassemia silent carrier; Poor weight gain of pregnancy, unspecified trimester; Anemia in pregnancy; and Thrombocytopenia affecting pregnancy (HCC) on their problem list.  Patient reports fatigue, occasional contractions and difficulty sleeping.  Contractions: Irritability. Vag. Bleeding: None.  Movement: Present. Denies leaking of fluid.   The following portions of the patient's history were reviewed and updated as appropriate: allergies, current medications, past family history, past medical history, past social history, past surgical history and problem list.   Objective:   Vitals:   01/03/20 1623  BP: 105/66  Pulse: 96  Weight: 173 lb 9.6 oz (78.7 kg)    Fetal Status: Fetal Heart Rate (bpm): 161 Fundal Height: 38 cm Movement: Present     General:  Alert, oriented and cooperative. Patient is in no acute distress.  Skin: Skin is warm and dry. No rash noted.   Cardiovascular: Normal heart rate noted  Respiratory: Normal respiratory effort, no problems with respiration noted  Abdomen: Soft, gravid, appropriate for gestational age.  Pain/Pressure: Present     Pelvic: Cervical exam performed in the presence of a chaperone Dilation: Closed Effacement (%): 50 Station: Ballotable  Extremities: Normal range of motion.  Edema: None  Mental Status: Normal mood and affect. Normal behavior. Normal judgment and thought content.   Assessment and Plan:  Pregnancy: G2P0010 at [redacted]w[redacted]d 1. Supervision of high risk pregnancy, antepartum, third trimester - GC/Chlamydia probe amp ([place])not at [facility] - Vertex presentation confirmed with POCUS  - Peds list given  again and reiterated the importance of choosing a pediatrician  2. Thrombocytopenia affecting pregnancy (HCC) - CBC today   3. Anemia during pregnancy in third trimester - CBC today   4. Alpha thalassemia silent carrier  5. Group B streptococcal carriage complicating pregnancy - treat in labor, discussed with patient   Preterm labor symptoms and general obstetric precautions including but not limited to vaginal bleeding, contractions, leaking of fluid and fetal movement were reviewed in detail with the patient. Please refer to After Visit Summary for other counseling recommendations.   Return in about 1 week (around 01/10/2020) for LOB, In-Person, any provider.  No future appointments.  Vonzella Nipple, PA-C

## 2020-01-03 NOTE — Patient Instructions (Addendum)
Fetal Movement Counts °Patient Name: ________________________________________________ Patient Due Date: ____________________ °What is a fetal movement count? ° °A fetal movement count is the number of times that you feel your baby move during a certain amount of time. This may also be called a fetal kick count. A fetal movement count is recommended for every pregnant woman. You may be asked to start counting fetal movements as early as week 28 of your pregnancy. °Pay attention to when your baby is most active. You may notice your baby's sleep and wake cycles. You may also notice things that make your baby move more. You should do a fetal movement count: °· When your baby is normally most active. °· At the same time each day. °A good time to count movements is while you are resting, after having something to eat and drink. °How do I count fetal movements? °1. Find a quiet, comfortable area. Sit, or lie down on your side. °2. Write down the date, the start time and stop time, and the number of movements that you felt between those two times. Take this information with you to your health care visits. °3. Write down your start time when you feel the first movement. °4. Count kicks, flutters, swishes, rolls, and jabs. You should feel at least 10 movements. °5. You may stop counting after you have felt 10 movements, or if you have been counting for 2 hours. Write down the stop time. °6. If you do not feel 10 movements in 2 hours, contact your health care provider for further instructions. Your health care provider may want to do additional tests to assess your baby's well-being. °Contact a health care provider if: °· You feel fewer than 10 movements in 2 hours. °· Your baby is not moving like he or she usually does. °Date: ____________ Start time: ____________ Stop time: ____________ Movements: ____________ °Date: ____________ Start time: ____________ Stop time: ____________ Movements: ____________ °Date: ____________  Start time: ____________ Stop time: ____________ Movements: ____________ °Date: ____________ Start time: ____________ Stop time: ____________ Movements: ____________ °Date: ____________ Start time: ____________ Stop time: ____________ Movements: ____________ °Date: ____________ Start time: ____________ Stop time: ____________ Movements: ____________ °Date: ____________ Start time: ____________ Stop time: ____________ Movements: ____________ °Date: ____________ Start time: ____________ Stop time: ____________ Movements: ____________ °Date: ____________ Start time: ____________ Stop time: ____________ Movements: ____________ °This information is not intended to replace advice given to you by your health care provider. Make sure you discuss any questions you have with your health care provider. °Document Revised: 02/01/2019 Document Reviewed: 02/01/2019 °Elsevier Patient Education © 2020 Elsevier Inc. °Braxton Hicks Contractions °Contractions of the uterus can occur throughout pregnancy, but they are not always a sign that you are in labor. You may have practice contractions called Braxton Hicks contractions. These false labor contractions are sometimes confused with true labor. °What are Braxton Hicks contractions? °Braxton Hicks contractions are tightening movements that occur in the muscles of the uterus before labor. Unlike true labor contractions, these contractions do not result in opening (dilation) and thinning of the cervix. Toward the end of pregnancy (32-34 weeks), Braxton Hicks contractions can happen more often and may become stronger. These contractions are sometimes difficult to tell apart from true labor because they can be very uncomfortable. You should not feel embarrassed if you go to the hospital with false labor. °Sometimes, the only way to tell if you are in true labor is for your health care provider to look for changes in the cervix. The health care provider   will do a physical exam and may  monitor your contractions. If you are not in true labor, the exam should show that your cervix is not dilating and your water has not broken. °If there are no other health problems associated with your pregnancy, it is completely safe for you to be sent home with false labor. You may continue to have Braxton Hicks contractions until you go into true labor. °How to tell the difference between true labor and false labor °True labor °· Contractions last 30-70 seconds. °· Contractions become very regular. °· Discomfort is usually felt in the top of the uterus, and it spreads to the lower abdomen and low back. °· Contractions do not go away with walking. °· Contractions usually become more intense and increase in frequency. °· The cervix dilates and gets thinner. °False labor °· Contractions are usually shorter and not as strong as true labor contractions. °· Contractions are usually irregular. °· Contractions are often felt in the front of the lower abdomen and in the groin. °· Contractions may go away when you walk around or change positions while lying down. °· Contractions get weaker and are shorter-lasting as time goes on. °· The cervix usually does not dilate or become thin. °Follow these instructions at home: ° °· Take over-the-counter and prescription medicines only as told by your health care provider. °· Keep up with your usual exercises and follow other instructions from your health care provider. °· Eat and drink lightly if you think you are going into labor. °· If Braxton Hicks contractions are making you uncomfortable: °? Change your position from lying down or resting to walking, or change from walking to resting. °? Sit and rest in a tub of warm water. °? Drink enough fluid to keep your urine pale yellow. Dehydration may cause these contractions. °? Do slow and deep breathing several times an hour. °· Keep all follow-up prenatal visits as told by your health care provider. This is important. °Contact a  health care provider if: °· You have a fever. °· You have continuous pain in your abdomen. °Get help right away if: °· Your contractions become stronger, more regular, and closer together. °· You have fluid leaking or gushing from your vagina. °· You pass blood-tinged mucus (bloody show). °· You have bleeding from your vagina. °· You have low back pain that you never had before. °· You feel your baby’s head pushing down and causing pelvic pressure. °· Your baby is not moving inside you as much as it used to. °Summary °· Contractions that occur before labor are called Braxton Hicks contractions, false labor, or practice contractions. °· Braxton Hicks contractions are usually shorter, weaker, farther apart, and less regular than true labor contractions. True labor contractions usually become progressively stronger and regular, and they become more frequent. °· Manage discomfort from Braxton Hicks contractions by changing position, resting in a warm bath, drinking plenty of water, or practicing deep breathing. °This information is not intended to replace advice given to you by your health care provider. Make sure you discuss any questions you have with your health care provider. °Document Revised: 05/27/2017 Document Reviewed: 10/28/2016 °Elsevier Patient Education © 2020 Elsevier Inc. ° °AREA PEDIATRIC/FAMILY PRACTICE PHYSICIANS ° °[place] ([address]) °• [facility] °o Chambliss, MD; Eniola, MD; Hale, MD; Hensel, MD; McDiarmid, MD; McIntyer, MD; Neal, MD; Walden, MD °o [address] [place], [place] [address] °o [phone] °o Mon-Fri 8:30-12:30, 1:30-5:00 °o Providers come to see babies   at [facility] °o Accepting Medicaid °• [facility] °o Limited providers who accept newborns: Koirala, MD; Morrow, MD; Wolters, MD °o [address], [place], [place] [address] °o [phone] °o Mon-Fri 8:00-5:30 °o Babies seen by providers at  [facility] °o Does NOT accept Medicaid °o Please call early in hospitalization for appointment (limited availability)  °• [facility] °o Mulberry, MD °o [address] [place], [place] [address] °o [phone] °o Mon, Tue, Thur, Fri 8:30-5:00, Wed 10:00-7:00 (closed 1-2pm) °o Babies seen by [facility] providers °o Accepting Medicaid °• [facility] °o Rubin, MD °o [address], [place], [place] [address] °o [phone] °o Mon-Fri 8:30-5:00, Sat 8:30-12:00 °o Provider comes to see babies at [facility] °o Accepting Medicaid °o Must have been referred from current patients or contacted office prior to delivery °• [facility] ([facility]) °o Brown, MD; Chandler, MD; Ettefagh, MD; Grant, MD; Lester, MD; McCormick, MD; McQueen, MD; Prose, MD; Simha, MD; Stanley, MD; Stryffeler, NP; Tebben, NP °o [address], [place], [place] [address] °o [phone] °o Mon, Tue, Thur, Fri 8:30-5:30, Wed 9:30-5:30, Sat 8:30-12:30 °o Babies seen by [facility] providers °o Accepting Medicaid °o Only accepting infants of first-time parents or siblings of current patients °o Hospital discharge coordinator will make follow-up appointment °• Jack Amos °o [address], [place], [place]  [address] °o [phone]   Fax - [phone] °• [facility] °o [address], [place], [place]  [address] °o Phone - [phone]   Fax - [phone] °• Shilpa Gosrani °o [address], [place], [place]  [address] °o [phone] ° °[place] ([address]) °• [facility] °o Bates, MD; Brassfield, MD; Cooper, Cox, MD; MD; Davis, MD; Dovico, MD; Ettefaugh, MD; Little, MD; Lowe, MD; Keiffer, MD; Melvin, MD; Sumner, MD; Williams, MD °o [address], [place], [place] [address] °o [phone] °o Mon-Fri 8:30-5:00 (extended evenings Mon-Thur as needed), Sat-Sun  10:00-1:00 °o Providers come to see babies at [facility] °o Accepting Medicaid for families of first-time babies and families with all children in the household age 3 and under. Must register with office prior to making appointment (M-F only). °• [facility] °o Henson, NP; Knapp, MD; Lalonde, MD; Tysinger, PA °o [address] [place], [place] [address] °o [phone] °o Mon-Fri 8:00-5:00 °o Babies seen by providers at [facility] °o Does NOT accept Medicaid/Commercial Insurance Only °• [facility] ([facility]  °o Artis, MD; Barnes, MD; Bratton, MD; Coccaro, MD; Lockett Gardner, MD; Kramer, MD; Marshall, MD; Netherton, MD; Poleto, MD; Skinner, MD °o [address] [place], [place] [address] °o [phone] °o Mon-Fri 8:30-5:30, Sat (Oct.-Mar.) 9:00-1:00 °o Babies seen by providers at [facility] °o Accepting Medicaid ° °[place] ([address]) °• [facility] °o Reid, MD; Warner, MD °o [address], [place], [place] [address] °o [phone] °o Mon-Fri 8:30-5:00, Sat 8:30-12:00 °o Providers come to see babies at [facility] °o Does NOT accept Medicaid °• [facility] °o Becker, PA; Hagler, MD; Scifres, PA; Sun, MD; Swayne, MD °o [address], , [place] [address] °o [phone] °o Mon-Fri 8:00-5:00 °o Babies seen by providers at [facility] °o Does NOT accept Medicaid °o Only accepting babies of parents who are patients °o Please call   early in hospitalization for appointment (limited availability) °• [facility] °o Clark, MD; Frye, MD; Kelleher, MD; Mack, NP; Miller, MD; O'Keller, MD; Patterson, NP; Pudlo, MD; Puzio, MD; Thomas, MD; Tucker, MD; Twiselton, MD °o [address], [place], [place] [address] °o [phone] °o Mon-Fri 8:00-5:00, Sat 9:00-12:00 °o Providers come to see babies at [facility] °o Does NOT accept  Medicaid ° °[place] ([address]) °• [facility] °o Limited providers accepting new patients: Brake, NP; Wharton, PA °o [address], [place], [place] [address] °o [phone] °o Mon-Fri 8:00-5:00 °o Babies seen by providers at [facility] °o Does NOT accept Medicaid °o Only accepting babies of parents who are patients °o Please call early in hospitalization for appointment (limited availability) °• [facility] °o Gay, MD; Quinlan, MD °o [address] [place], [place] [address] °o [phone] (press 1 to schedule appointment) °o Mon-Fri 8:00-5:00 °o Providers come to see babies at [facility] °o Does NOT accept Medicaid °• [facility] °o Mazer, MD °o [address] [place], [place] [address] °o [phone] °o Mon-Fri 8:30-5:00 (lunch 12:30-1:00), extended hours by appointment only Wed 5:00-6:30 °o Babies seen by [facility] providers °o Accepting Medicaid °• [facility] °o Banks, MD; Jordan, MD; Koberlein, MD °o [address], [place], [place] [address] °o [phone] °o Mon-Fri 8:00-5:00 °o Babies seen by [facility] providers °o Does NOT accept Medicaid °• [facility] °o Parker, MD; Hunter, MD; Wallace, DO °o [address] [place], [place] [address] °o [phone] °o Mon-Fri 8:00-5:00 °o Babies seen by [facility] providers °o Does NOT accept Medicaid °• [facility] °o Brandon, PA; Brecken, PA; Christy, NP; Dees, MD; DeClaire, MD; DeWeese, MD; Hansen, NP; Mills, NP; Parrish, NP; Smoot, NP; Summer, MD; Vapne, MD °o [address] [place], [place] [address] °o [phone] °o Mon-Fri 8:30-5:00, Sat 10:00-1:00 °o Providers come to see babies at [facility] °o Does NOT accept Medicaid °o Free prenatal information session Tuesdays at 4:45pm °• [facility] °o Bouska, MD; Gordon, PA; Jeffery, PA; Weber, PA °o [address] [place] [address] °o [phone] °o Mon-Fri 7:30-5:30 °o Babies seen by [facility] providers °• [facility] °o [address], [place], [place]  [address] °o [phone]   Fax - [phone] ° °[place] ([address]) °• [facility] °o Reese, MD °o [address] [place], [place] [address] °o [phone] °o Mon-Thur 8:00-6:00 °o Providers come to see babies at [facility] °o Accepting Medicaid °• [facility] °o Anderson, NP; Badger, MD; Beal, PA; Spencer, PA °o [address] [place], [place] [address] °o [phone] °o Mon-Thur 7:30-7:30, Fri 7:30-4:30 °o Babies seen by [facility] providers °o Accepting Medicaid °• [facility] °o Agbuya, MD; Klett, NP; Romgoolam, MD °o [address], [place], [place] [address] °o [phone] °o Mon-Fri 8:30-5:00, Sat 8:30-12:00 °o Providers come to see babies at [facility] °o Accepting Medicaid °o Must have “Meet & Greet” appointment at office prior to delivery °• [facility] ([facility] °o McCord, MD; Wallace, MD; Wood, MD °o [address], [place], [place] [address] °o [phone] °o Mon-Wed 8:00-6:00, Thur-Fri 8:00-5:00, Sat 9:00-12:00 °o Providers come to see babies at [facility] °o Does NOT accept Medicaid °o Only accepting siblings of current patients °• [facility]  °  o [address], [place], [place]  [address] °o [phone]   Fax - [phone] °• [facility] °o [address], [place], [place]  [address] °o [phone]   Fax - [phone] ° °[place] ([address]) °• [facility] [facility] °o Cirigliano, DO; Matthews, DO °o [address] [place], [place] [address] °o [phone] °o Mon-Fri 7:00-5:00 °o Babies seen by [facility] providers °o Does NOT  accept Medicaid °• [facility] °o Briscoe, MD; Howley, PA; Moreira, PA °o [address], [place], [place] [address] °o [phone] °o Mon-Fri 8:00-5:00 °o Babies seen by [facility] providers °o Accepting Medicaid °• [facility] °o Boyd, MD; Church, PA; Jones, NP; Osborn, PA °o [address], [place], [place] [address] °o [phone] °o Mon-Fri 8:00-5:00 °o Babies seen by providers at [facility] °o Accepting Medicaid ° °[place] ([address]) °•  [facility] [place] °o Wendling, DO °o [address] [place], [place] [address] °o [phone] °o Mon-Fri 8:00-5:00 °o Babies seen by [facility] providers °o Does NOT accept Medicaid °o Limited availability, please call early in hospitalization to schedule follow-up °• [facility] °o Calderon, PA; Cummings, MD; Dillard, MD; Martin, PA; Olson, MD; VanDeven, PA °o [address], [place], [place] [address] °o [phone] °o Mon-Fri 8:30-5:00, Sat 9:00-12:00 °o Babies seen by providers at [facility] °o Accepting Medicaid °o Please register online then schedule online or call office °o [email or web address] °• [facility] ([facility] °o Hunter, NP; Kumar, MD; Martin Rogers, PA °o [address], [place], [place] [address] °o [phone] °o Mon-Fri 8:00-5:00 °o Babies seen by providers at [facility] °o Accepting Medicaid °• [facility] ([facility] °o Boyle, MD; Kristi Fleenor, NP; West, MD °o [address], [place], [place] [address] °o [phone] °o Mon-Fri 8:00-5:30, Sat&Sun by appointment (phones open at 8:30) °o Babies seen by [facility] providers °o Accepting Medicaid °o Must be a first-time baby or sibling of current patient °• [facility] [place]  °o [address], [place], [place]  [address] °o [phone]   Fax - [phone] ° °[place] ([address]) °• [facility] °o Brown, PA; Cowen, PA; Rice, MD; Helton, PA; Spry, MD °o [address] [place], [place] [address] °o [phone] °o Mon-Thur 8:00-7:00, Fri 8:00-5:00, Sat 8:00-12:00, Sun 9:00-12:00 °o Babies seen by [facility] providers °o Accepting Medicaid °• [facility] °o Coe-Goins, MD; Marshall, MD; Pierre-Louis, MD °o [address], [place], [place] [address] °o [phone] °o Mon-Thur 8:00-5:00 °o Babies seen by providers at [facility] °o Accepting Medicaid °• [facility] °o Bratton, MD; Coe-Goins, MD; Hayes, MD; Lewis, MD; List, MD; Lott, MD; Marshall, MD; Moran, MD; O'Neal, MD; Pierre-Louis, MD; Pitonzo, MD; Scholer, MD; Spangle, MD °o [address] [place], [place] [address] °o [phone] °o Mon-Fri 8:00-5:30, Sat (Oct.-Mar.) 9:00-1:00 °o Babies seen by providers at [facility] °o Accepting Medicaid °o Must fill out new patient packet, available online at [email or web address] °• [facility] ([facility] °o Friddle, NP; Harris, NP; Kelly, NP; Logan,   MD; Melvin, PA; Poth, MD; Ramadoss, MD; Stanton, NP °o [address], [place], [place] [address] °o [phone] °o Mon-Thur 8:00-5:30, Fri 8:00-5:00 °o Babies seen by providers at [facility] °o Accepting Medicaid ° °[place] ([address]) °• [facility] °o Dixon, PA; Bremond, MD; Pickard, MD; Tapia, PA °o [address], [place], [place] [address] °o [phone] °o Mon-Fri 8:00-5:00 °o Babies seen by providers at [facility] °o Accepting Medicaid  ° °[place] ([address]) °• [facility] [place] °o Masneri, DO; Meyers, MD; Nelson, PA °o [address], [place], [place]  [address] °o [phone] °o Mon-Fri 8:00-5:00 °o Babies seen by providers at [facility] °o Does NOT accept Medicaid °o Limited appointment availability, please call early in hospitalization ° °• [facility] [place] °o Kunedd, DO; McGowen, MD °o [address], [place], [place] [address] °o [phone] °o Mon-Fri 8:00-5:00 °o Babies seen by [facility] providers °o Does NOT accept Medicaid °• [facility] [place] °o Cameron, MD; MacDonald, MD; Michaels, PA; Nayak, MD °o [address] [place] [address], [place], [place] [address] °o [phone] °o Mon-Fri 8:00-5:00 °o After hours clinic ([address] [place], [place] [address]) [phone] Mon-Fri 5:00-8:00, Sat 12:00-6:00, Sun 10:00-4:00 °o Babies seen by [facility] providers °o Accepting Medicaid °• [facility] [place] °o [address], [place], [place]  [address] °o [phone]   Fax - [phone] ° °[place] ([address]) °• [facility] [place] °o Andy, MD °o [address], [place], [place] [address] °o [phone] °o Mon-Fri 8:00-5:00 °o Babies seen by [facility] providers °o Does NOT accept Medicaid °• [facility] [place] ([facility] [place]) °o Eksir, MD °o [address], [place], [place] [address] °o [phone] °o Mon-Thur 8:00-7:00, Fri 8:00-5:00, Sat 8:00-12:00 °o Babies seen by providers at [facility] °o Accepting Medicaid - but does not have vaccinations in office (must be received elsewhere) °o Limited availability, please call early in hospitalization ° °[place] ([address]) °• [facility]  °o Charlene Flemming, MD °o [address], [place] [address] °o [phone]  Fax [phone] ° ° °

## 2020-01-04 LAB — CBC
Hematocrit: 31 % — ABNORMAL LOW (ref 34.0–46.6)
Hemoglobin: 9.9 g/dL — ABNORMAL LOW (ref 11.1–15.9)
MCH: 26.7 pg (ref 26.6–33.0)
MCHC: 31.9 g/dL (ref 31.5–35.7)
MCV: 84 fL (ref 79–97)
Platelets: 129 10*3/uL — ABNORMAL LOW (ref 150–450)
RBC: 3.71 x10E6/uL — ABNORMAL LOW (ref 3.77–5.28)
RDW: 14.1 % (ref 11.7–15.4)
WBC: 7.3 10*3/uL (ref 3.4–10.8)

## 2020-01-04 LAB — GC/CHLAMYDIA PROBE AMP (~~LOC~~) NOT AT ARMC
Chlamydia: POSITIVE — AB
Comment: NEGATIVE
Comment: NORMAL
Neisseria Gonorrhea: NEGATIVE

## 2020-01-07 ENCOUNTER — Telehealth: Payer: Self-pay | Admitting: *Deleted

## 2020-01-07 ENCOUNTER — Encounter: Payer: Self-pay | Admitting: *Deleted

## 2020-01-07 ENCOUNTER — Telehealth: Payer: Self-pay | Admitting: Family Medicine

## 2020-01-07 MED ORDER — AZITHROMYCIN 250 MG PO TABS: 1000.0000 mg | ORAL_TABLET | Freq: Once | ORAL | 0 refills | Status: AC

## 2020-01-07 NOTE — Telephone Encounter (Signed)
-----   Message from Marny Lowenstein, PA-C sent at 01/07/2020  8:41 AM EDT ----- + Chlamydia. Rx sent. Please send STD report to [facility] and inform patient of Dx and Rx and need for partner treatment by phone.   Vonzella Nipple, PA-C 01/07/2020 8:40 AM

## 2020-01-07 NOTE — Telephone Encounter (Signed)
Patient is requesting a call back. She has questions.

## 2020-01-07 NOTE — Telephone Encounter (Signed)
See other telephone note dated 01/07/20 for notes re: called patient back re: her questions Diamone Whistler,RN

## 2020-01-07 NOTE — Telephone Encounter (Signed)
Wm called back and had questions again about treatment , and retesting, and what if she goes into labor before.  I explained she and her partner need treatment because is STD and he should call his doctor or health department. I reviewed that she should avoid sexual contact until 2 weeks after both treated. I informed her we will do a TOC in about 2 weeks.  We discussed if she goes into labor before TOC it will depend on when she took the medication; but they may treat her in labor for chlamydia to protect the baby. She voices understanding. Anne Kirst,RN

## 2020-01-07 NOTE — Addendum Note (Signed)
Addended by: Marny Lowenstein on: 01/07/2020 08:41 AM   Modules accepted: Orders

## 2020-01-07 NOTE — Telephone Encounter (Signed)
Received STD report and notification from provider Jennelle +  Chlamydia. I called Anne Young and left a message I am calling with some important ,not urgent information and will send you a detailed  MyChart message. Please call us or read your MyChart message and let us know if you have questions. STD form for health department completed.  Anne Loja,RN  Will leave in inbox so we can see if patient reads message and call her again if needed= Anne Salberg,RN

## 2020-01-13 IMAGING — US US OB COMP LESS 14 WK
1 series · 15 of 28 positions shown · non-contrast
Comparison: None.

CLINICAL DATA: Nightly cramping

EXAM:
OBSTETRIC <14 WK ULTRASOUND
TECHNIQUE: Transabdominal ultrasound was performed for evaluation of the
gestation as well as the maternal uterus and adnexal regions.

[Series 1: us ob comp less 14 wk · 15 of 45 slices shown]
[im 1/45]
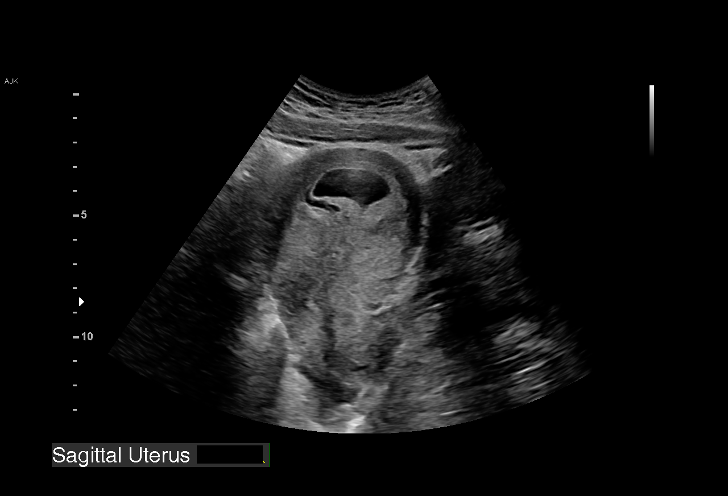
[im 4/45]
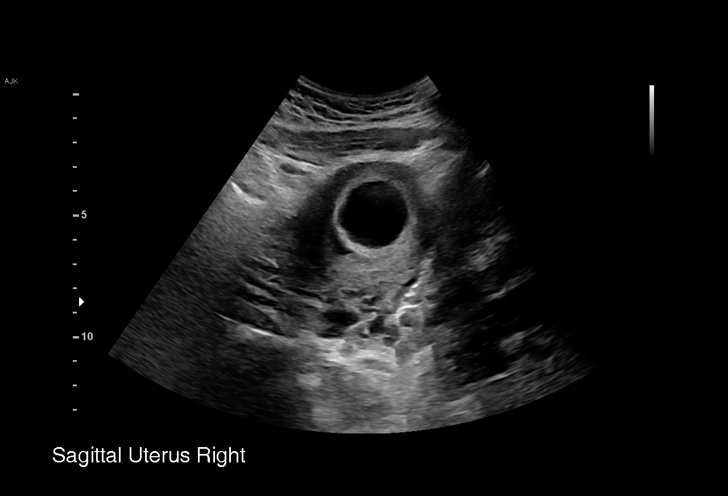
[im 7/45]
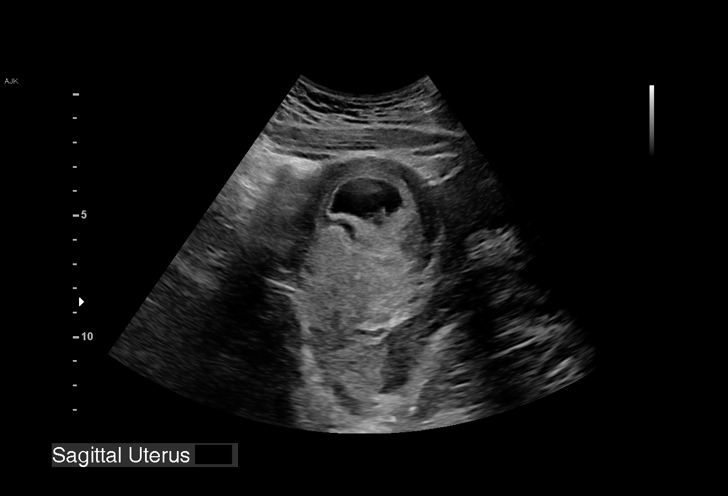
[im 10/45]
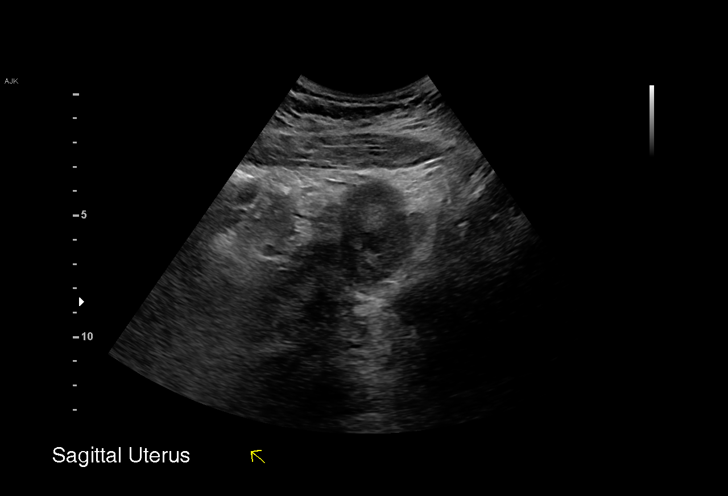
[im 14/45]
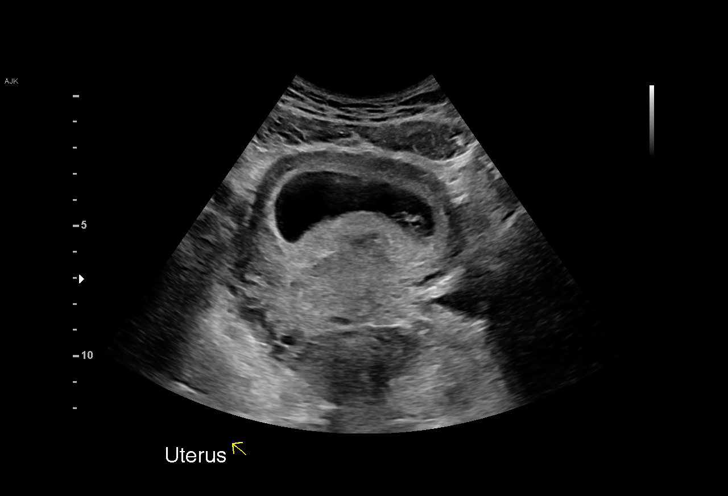
[im 17/45]
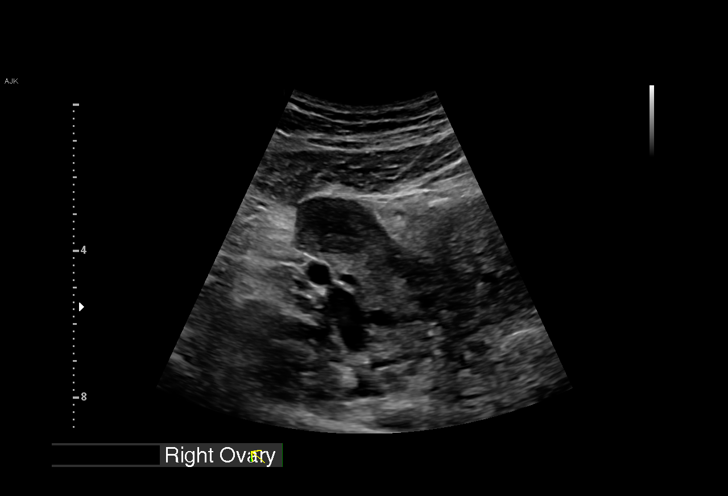
[im 20/45]
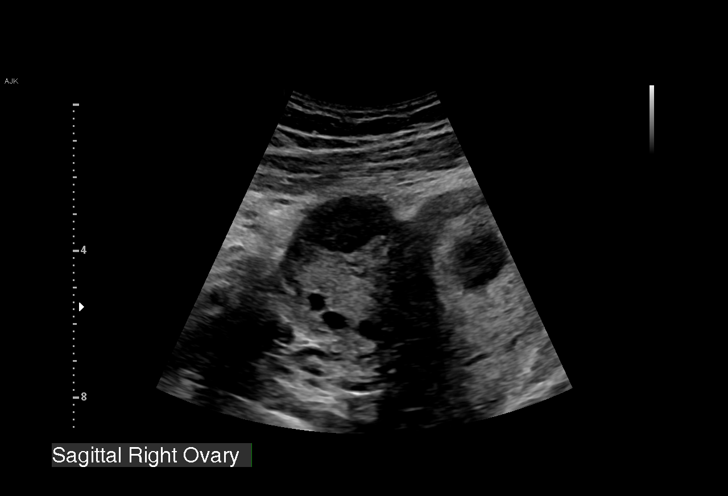
[im 23/45]
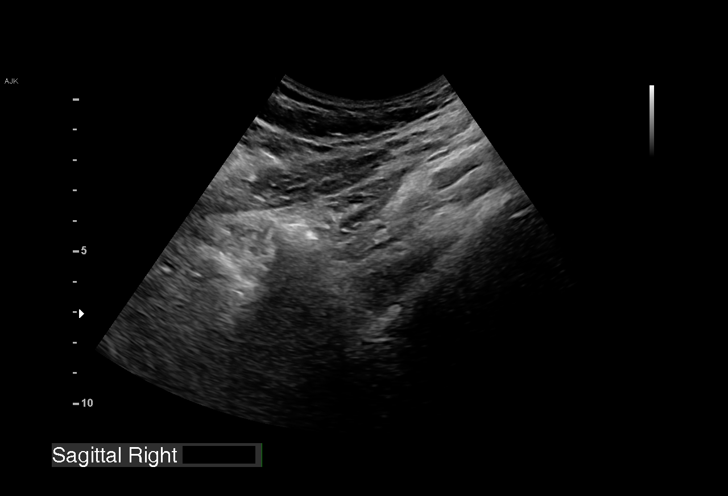
[im 25/45]
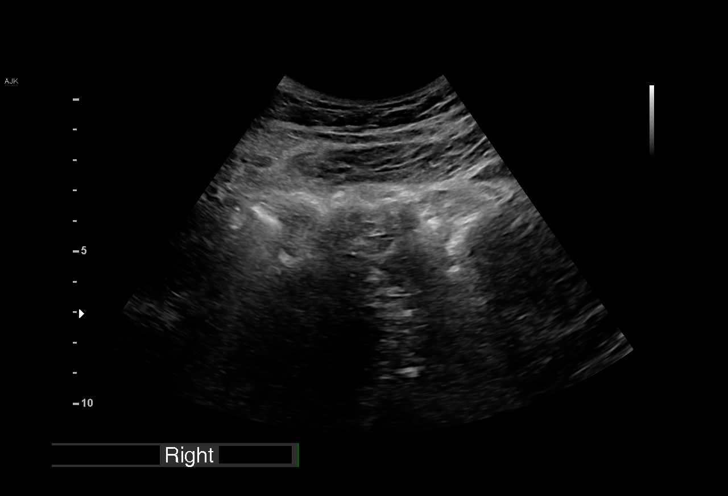
[im 28/45]
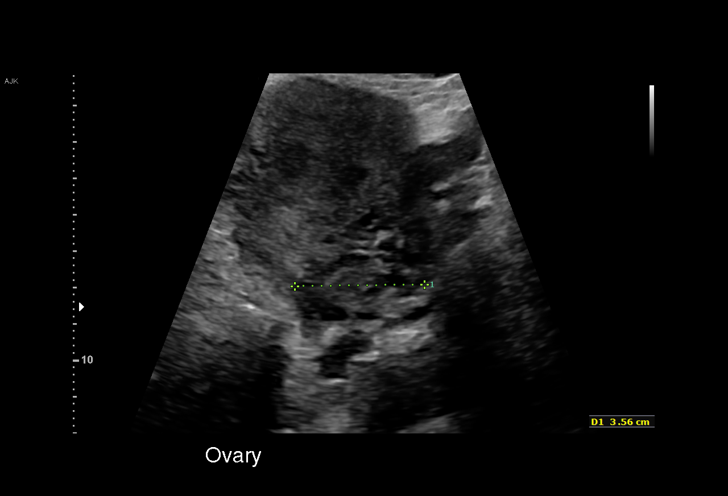
[im 31/45]
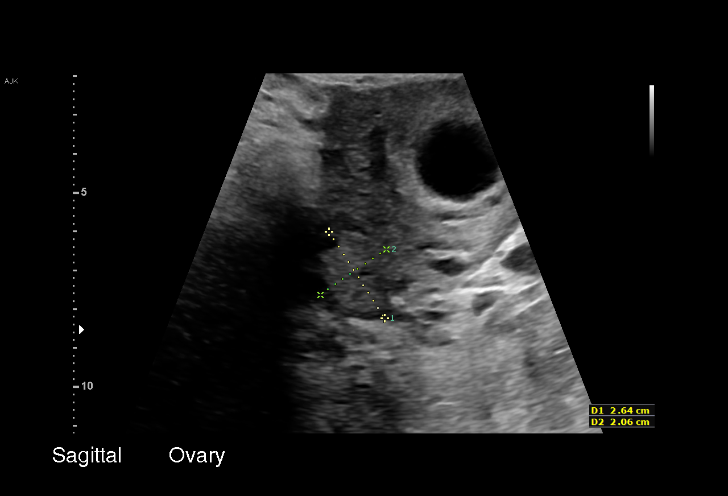
[im 35/45]
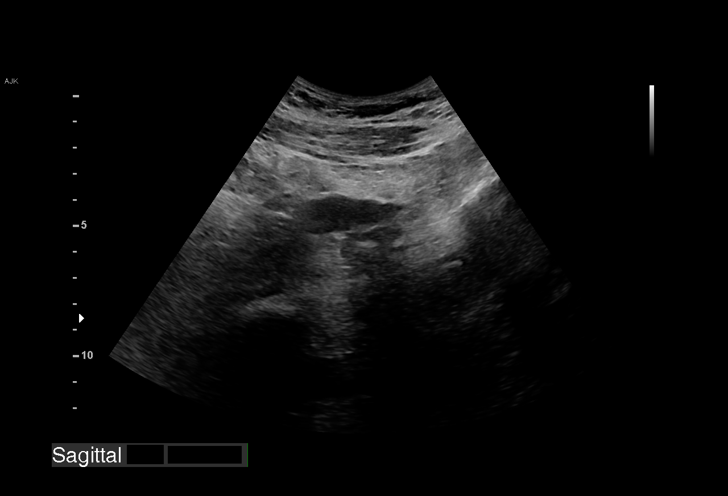
[im 38/45]
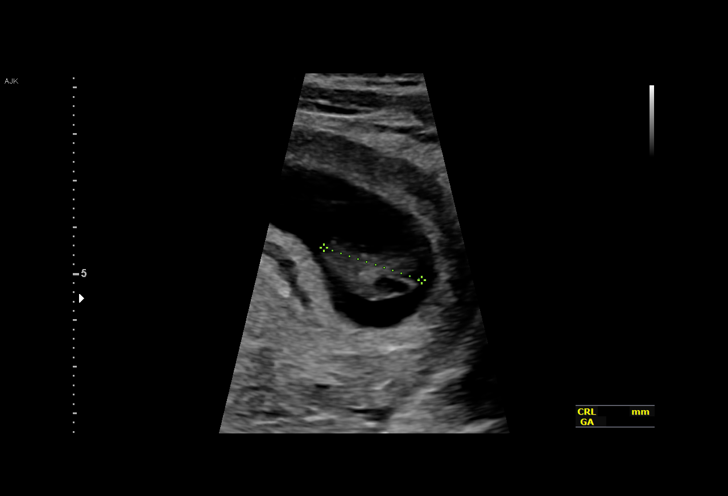
[im 41/45]
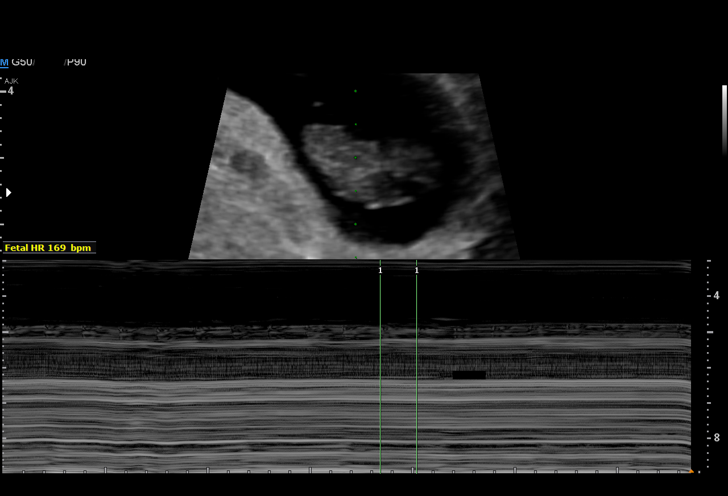
[im 45/45]
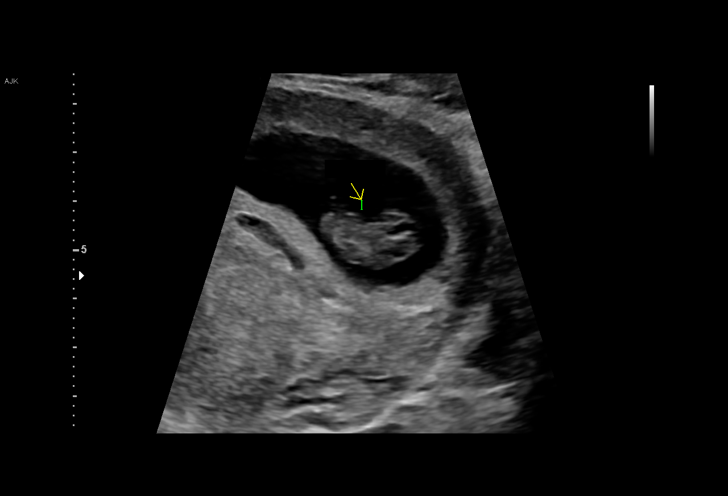

[15 of 28 positions shown; findings below may reference images not displayed]

FINDINGS: Intrauterine gestational sac: Single

Yolk sac:  Visualized.

Embryo:  Visualized.

Cardiac Activity: Visualized.

Heart Rate: 169 bpm

CRL: 22.5  mm   8 w 6 d                  US EDC: 01/30/2020

Subchorionic hemorrhage:  Small subchorionic hemorrhage.

Maternal uterus/adnexae: Corpus luteum cyst in the right ovary.
Ovaries are otherwise normal. No pelvic free fluid.
IMPRESSION: 1. Single live intrauterine pregnancy as detailed above.
2. Small subchorionic hemorrhage. Attention on follow-up examination
is recommended.

## 2020-01-14 ENCOUNTER — Other Ambulatory Visit: Payer: Self-pay

## 2020-01-14 ENCOUNTER — Ambulatory Visit (INDEPENDENT_AMBULATORY_CARE_PROVIDER_SITE_OTHER): Payer: Medicaid Other | Admitting: Obstetrics and Gynecology

## 2020-01-14 ENCOUNTER — Encounter: Payer: Self-pay | Admitting: Obstetrics and Gynecology

## 2020-01-14 VITALS — BP 113/68 | HR 100 | Wt 177.8 lb

## 2020-01-14 DIAGNOSIS — O98813 Other maternal infectious and parasitic diseases complicating pregnancy, third trimester: Secondary | ICD-10-CM

## 2020-01-14 DIAGNOSIS — D563 Thalassemia minor: Secondary | ICD-10-CM

## 2020-01-14 DIAGNOSIS — O0993 Supervision of high risk pregnancy, unspecified, third trimester: Secondary | ICD-10-CM

## 2020-01-14 DIAGNOSIS — O99820 Streptococcus B carrier state complicating pregnancy: Secondary | ICD-10-CM

## 2020-01-14 DIAGNOSIS — A749 Chlamydial infection, unspecified: Secondary | ICD-10-CM

## 2020-01-14 DIAGNOSIS — O99119 Other diseases of the blood and blood-forming organs and certain disorders involving the immune mechanism complicating pregnancy, unspecified trimester: Secondary | ICD-10-CM

## 2020-01-14 DIAGNOSIS — D696 Thrombocytopenia, unspecified: Secondary | ICD-10-CM

## 2020-01-14 DIAGNOSIS — Z3A37 37 weeks gestation of pregnancy: Secondary | ICD-10-CM

## 2020-01-14 DIAGNOSIS — O99113 Other diseases of the blood and blood-forming organs and certain disorders involving the immune mechanism complicating pregnancy, third trimester: Secondary | ICD-10-CM

## 2020-01-14 NOTE — Progress Notes (Signed)
   PRENATAL VISIT NOTE  Subjective:  Anne Young is a 29 y.o. G2P0010 at [redacted]w[redacted]d being seen today for ongoing prenatal care.  She is currently monitored for the following issues for this high-risk pregnancy and has Supervision of high risk pregnancy, antepartum, third trimester; Group B streptococcal carriage complicating pregnancy; Alpha thalassemia silent carrier; Poor weight gain of pregnancy, unspecified trimester; Anemia in pregnancy; and Thrombocytopenia affecting pregnancy (HCC) on their problem list.  Patient reports occasional contractions.  Contractions: Irritability. Vag. Bleeding: None.  Movement: Present. Denies leaking of fluid.   The following portions of the patient's history were reviewed and updated as appropriate: allergies, current medications, past family history, past medical history, past social history, past surgical history and problem list.   Objective:   Vitals:   01/14/20 1109  BP: 113/68  Pulse: 100  Weight: 80.6 kg    Fetal Status: Fetal Heart Rate (bpm): 146 Fundal Height: 38 cm Movement: Present  Presentation: Vertex  General:  Alert, oriented and cooperative. Patient is in no acute distress.  Skin: Skin is warm and dry. No rash noted.   Cardiovascular: Normal heart rate noted  Respiratory: Normal respiratory effort, no problems with respiration noted  Abdomen: Soft, gravid, appropriate for gestational age.  Pain/Pressure: Present     Pelvic: without difficulty Dilation: Closed Effacement (%): 40, 50 Station: -3  Extremities: Normal range of motion.  Edema: None  Mental Status: Normal mood and affect. Normal behavior. Normal judgment and thought content.   Assessment and Plan:  Pregnancy: G2P0010 at [redacted]w[redacted]d 1. Supervision of high risk pregnancy, antepartum, third trimester Recognition of labor discussed.  2. Thrombocytopenia affecting pregnancy (HCC) Will repeat platelet count at next appointment.  3. Chlamydia infection affecting pregnancy in third  trimester, antepartum Patient recently treated.  She denies coitus since treatment.  Discussed risks of Chlamydia in pregnancy.  Will perform test of cure at next appointment.  4. Group B streptococcal carriage complicating pregnancy Patient to be treated in labor.    5. Alpha thalassemia silent carrier   Term labor symptoms and general obstetric precautions including but not limited to vaginal bleeding, contractions, leaking of fluid and fetal movement were reviewed in detail with the patient. Please refer to After Visit Summary for other counseling recommendations.   Return in about 1 week (around 01/21/2020) for [facility].  Future Appointments  Date Time Provider Department Center  01/21/2020 10:15 AM Johnny Bridge, MD [facility]  01/28/2020  9:15 AM Currie Paris, NP [facility]  02/05/2020  9:15 AM Marylene Land, CNM [facility]  02/11/2020  9:15 AM Marvetta Gibbons Brand Males, NP [facility]    Johnny Bridge, MD

## 2020-01-20 ENCOUNTER — Encounter (HOSPITAL_COMMUNITY): Payer: Self-pay | Admitting: Obstetrics and Gynecology

## 2020-01-20 ENCOUNTER — Other Ambulatory Visit: Payer: Self-pay

## 2020-01-20 ENCOUNTER — Inpatient Hospital Stay (HOSPITAL_COMMUNITY)
Admission: AD | Admit: 2020-01-20 | Discharge: 2020-01-20 | Disposition: A | Discharge status: Home / Self Care | Payer: Medicaid Other | Attending: Obstetrics and Gynecology | Admitting: Obstetrics and Gynecology

## 2020-01-20 DIAGNOSIS — R109 Unspecified abdominal pain: Secondary | ICD-10-CM

## 2020-01-20 DIAGNOSIS — Z3A38 38 weeks gestation of pregnancy: Secondary | ICD-10-CM | POA: Diagnosis not present

## 2020-01-20 DIAGNOSIS — N949 Unspecified condition associated with female genital organs and menstrual cycle: Secondary | ICD-10-CM

## 2020-01-20 DIAGNOSIS — R102 Pelvic and perineal pain: Secondary | ICD-10-CM | POA: Diagnosis not present

## 2020-01-20 DIAGNOSIS — O26893 Other specified pregnancy related conditions, third trimester: Secondary | ICD-10-CM

## 2020-01-20 LAB — URINALYSIS, ROUTINE W REFLEX MICROSCOPIC
Bilirubin Urine: NEGATIVE
Glucose, UA: NEGATIVE mg/dL
Hgb urine dipstick: NEGATIVE
Ketones, ur: 5 mg/dL — AB
Nitrite: NEGATIVE
Protein, ur: NEGATIVE mg/dL
Specific Gravity, Urine: 1.013 (ref 1.005–1.030)
pH: 7 (ref 5.0–8.0)

## 2020-01-20 NOTE — MAU Note (Signed)
Tyniya Kuyper is a 29 y.o. at [redacted]w[redacted]d here in MAU reporting: +abdominal pain; both sides laterally Radiates to lower back "piercing" in nature Intermittent Worse at night Onset of complaint: thursday Pain score: 8/10 Vitals:   01/20/20 1351  BP: 109/67  Pulse: 83  Resp: 19  Temp: 98.4 F (36.9 C)  SpO2: 100%     Denies vaginal bleeding, LOF, or discharge.   +FM Lab orders placed from triage: ua; patient unable to leave sample at this time.

## 2020-01-20 NOTE — Discharge Instructions (Signed)

## 2020-01-20 NOTE — MAU Provider Note (Signed)
History     CSN: [id number]  Arrival date and time: 01/20/20 1340   First Provider Initiated Contact with Patient 01/20/20 1419      Chief Complaint  Patient presents with  . Abdominal Pain   HPI Anne Young is a 29 y.o. G2P0010 at 33w4dwho presents with side pain that only happens at night. She states when she goes to bed both sides have sharp pain that is worse with movement. She denies any pain at this time. She denies any leaking or bleeding. Reports normal fetal movement. She rates the pain a 8/10 and has not tried any medication for the pain.   OB History    Gravida  2   Para      Term      Preterm      AB  1   Living  0     SAB      TAB      Ectopic      Multiple      Live Births              Past Medical History:  Diagnosis Date  . BV (bacterial vaginosis)   . Medical history non-contributory   . Yeast vaginitis     Past Surgical History:  Procedure Laterality Date  . NO PAST SURGERIES      History reviewed. No pertinent family history.  Social History   Tobacco Use  . Smoking status: Never Smoker  . Smokeless tobacco: Never Used  Vaping Use  . Vaping Use: Never used  Substance Use Topics  . Alcohol use: Not Currently    Comment: occasionally  . Drug use: Never    Allergies: No Known Allergies  Medications Prior to Admission  Medication Sig Dispense Refill Last Dose  . ferrous sulfate 325 (65 FE) MG tablet Take 1 tablet (325 mg total) by mouth daily. 30 tablet 3 01/19/2020 at 1700  . acetaminophen (TYLENOL) 500 MG tablet Take 2 tablets (1,000 mg total) by mouth every 6 (six) hours as needed for moderate pain. 30 tablet 1   . Blood Pressure Monitoring (BLOOD PRESSURE KIT) DEVI 1 Device by Does not apply route as needed. 1 each 0   . Elastic Bandages & Supports (COMFORT FIT MATERNITY SUPP SM) MISC 1 Units by Does not apply route daily as needed. (Patient not taking: Reported on 12/03/2019) 1 each 0   . Nutritional Supplements (BOOST  MAX PROTEIN) LIQD Take 1 Container by mouth in the morning and at bedtime. 60 mL 3 01/18/2020  . Prenat w/o A Vit-FeFum-FePo-FA (CONCEPT OB) 130-92.4-1 MG CAPS Take 1 tablet by mouth daily. 30 capsule 12 01/18/2020    Review of Systems  Constitutional: Negative.  Negative for fatigue and fever.  HENT: Negative.   Respiratory: Negative.  Negative for shortness of breath.   Cardiovascular: Negative.  Negative for chest pain.  Gastrointestinal: Positive for abdominal pain. Negative for constipation, diarrhea, nausea and vomiting.  Genitourinary: Negative.  Negative for dysuria, vaginal bleeding and vaginal discharge.  Neurological: Negative.  Negative for dizziness and headaches.   Physical Exam   Blood pressure 109/67, pulse 83, temperature 98.4 F (36.9 C), resp. rate 19, weight 81.1 kg, last menstrual period 04/30/2019, SpO2 100 %.  Physical Exam Vitals and nursing note reviewed.  Constitutional:      General: She is not in acute distress.    Appearance: She is well-developed.  HENT:     Head: Normocephalic.  Eyes:  Pupils: Pupils are equal, round, and reactive to light.  Cardiovascular:     Rate and Rhythm: Normal rate and regular rhythm.     Heart sounds: Normal heart sounds.  Pulmonary:     Effort: Pulmonary effort is normal. No respiratory distress.     Breath sounds: Normal breath sounds.  Abdominal:     General: Bowel sounds are normal. There is no distension.     Palpations: Abdomen is soft.     Tenderness: There is no abdominal tenderness.  Skin:    General: Skin is warm and dry.  Neurological:     Mental Status: She is alert and oriented to person, place, and time.  Psychiatric:        Behavior: Behavior normal.        Thought Content: Thought content normal.        Judgment: Judgment normal.    Fetal Tracing:  Baseline: 140 Variability: moderate Accels: 15x15 Decels: none  Toco: 2-5   MAU Course  Procedures Results for orders placed or performed  during the hospital encounter of 01/20/20 (from the past 24 hour(s))  Urinalysis, Routine w reflex microscopic     Status: Abnormal   Collection Time: 01/20/20  2:49 PM  Result Value Ref Range   Color, Urine YELLOW YELLOW   APPearance HAZY (A) CLEAR   Specific Gravity, Urine 1.013 1.005 - 1.030   pH 7.0 5.0 - 8.0   Glucose, UA NEGATIVE NEGATIVE mg/dL   Hgb urine dipstick NEGATIVE NEGATIVE   Bilirubin Urine NEGATIVE NEGATIVE   Ketones, ur 5 (A) NEGATIVE mg/dL   Protein, ur NEGATIVE NEGATIVE mg/dL   Nitrite NEGATIVE NEGATIVE   Leukocytes,Ua TRACE (A) NEGATIVE   RBC / HPF 0-5 0 - 5 RBC/hpf   WBC, UA 6-10 0 - 5 WBC/hpf   Bacteria, UA RARE (A) NONE SEEN   Squamous Epithelial / LPF 0-5 0 - 5   Mucus PRESENT    MDM UA Offered cervical exam due to contractions and pain- patient declined stating the pain she is having is not from contractions Offered PO pain medication and patient declines stating she has no pain at this time.   Patient reports improvement in pain when using pregnancy support belt but does not like to wear it. Discussed importance of regular use to improve pain.   Assessment and Plan   1. Round ligament pain   2. [redacted] weeks gestation of pregnancy    -Discharge home in stable condition -Labor precautions discussed -Patient advised to follow-up with [facility] tomorrow as scheduled for prenatal care -Patient may return to MAU as needed or if her condition were to change or worsen   Wende Mott CNM 01/20/2020, 2:19 PM

## 2020-01-21 ENCOUNTER — Other Ambulatory Visit (HOSPITAL_COMMUNITY)
Admission: RE | Admit: 2020-01-21 | Discharge: 2020-01-21 | Disposition: A | Discharge status: Home / Self Care | Payer: Medicaid Other | Source: Ambulatory Visit | Attending: Obstetrics and Gynecology | Admitting: Obstetrics and Gynecology

## 2020-01-21 ENCOUNTER — Ambulatory Visit (INDEPENDENT_AMBULATORY_CARE_PROVIDER_SITE_OTHER): Payer: Medicaid Other | Admitting: Obstetrics and Gynecology

## 2020-01-21 ENCOUNTER — Encounter: Payer: Self-pay | Admitting: Obstetrics and Gynecology

## 2020-01-21 VITALS — BP 110/68 | HR 87 | Wt 177.6 lb

## 2020-01-21 DIAGNOSIS — Z202 Contact with and (suspected) exposure to infections with a predominantly sexual mode of transmission: Secondary | ICD-10-CM | POA: Insufficient documentation

## 2020-01-21 DIAGNOSIS — O99013 Anemia complicating pregnancy, third trimester: Secondary | ICD-10-CM

## 2020-01-21 DIAGNOSIS — O99113 Other diseases of the blood and blood-forming organs and certain disorders involving the immune mechanism complicating pregnancy, third trimester: Secondary | ICD-10-CM

## 2020-01-21 DIAGNOSIS — D649 Anemia, unspecified: Secondary | ICD-10-CM

## 2020-01-21 DIAGNOSIS — O99820 Streptococcus B carrier state complicating pregnancy: Secondary | ICD-10-CM

## 2020-01-21 DIAGNOSIS — D696 Thrombocytopenia, unspecified: Secondary | ICD-10-CM

## 2020-01-21 DIAGNOSIS — O99119 Other diseases of the blood and blood-forming organs and certain disorders involving the immune mechanism complicating pregnancy, unspecified trimester: Secondary | ICD-10-CM

## 2020-01-21 DIAGNOSIS — Z148 Genetic carrier of other disease: Secondary | ICD-10-CM

## 2020-01-21 DIAGNOSIS — Z8619 Personal history of other infectious and parasitic diseases: Secondary | ICD-10-CM

## 2020-01-21 DIAGNOSIS — Z3A38 38 weeks gestation of pregnancy: Secondary | ICD-10-CM

## 2020-01-21 DIAGNOSIS — O0993 Supervision of high risk pregnancy, unspecified, third trimester: Secondary | ICD-10-CM

## 2020-01-21 LAB — CULTURE, OB URINE: Culture: NO GROWTH

## 2020-01-21 NOTE — Progress Notes (Signed)
   PRENATAL VISIT NOTE  Subjective:  Anne Young is a 29 y.o. G2P0010 at [redacted]w[redacted]d being seen today for ongoing prenatal care.  She is currently monitored for the following issues for this high-risk pregnancy and has Supervision of high risk pregnancy, antepartum, third trimester; Group B streptococcal carriage complicating pregnancy; Alpha thalassemia silent carrier; Poor weight gain of pregnancy, unspecified trimester; Anemia in pregnancy; Thrombocytopenia affecting pregnancy (HCC); and Chlamydia contact, treated on their problem list.  Patient reports occasional contractions.  Contractions: Irritability. Vag. Bleeding: None.  Movement: Present. Denies leaking of fluid.   The following portions of the patient's history were reviewed and updated as appropriate: allergies, current medications, past family history, past medical history, past social history, past surgical history and problem list.   Objective:   Vitals:   01/21/20 1054  BP: 110/68  Pulse: 87  Weight: 177 lb 9.6 oz (80.6 kg)    Fetal Status: Fetal Heart Rate (bpm): 150 Fundal Height: 39 cm Movement: Present  Presentation: Vertex  General:  Alert, oriented and cooperative. Patient is in no acute distress.  Skin: Skin is warm and dry. No rash noted.   Cardiovascular: Normal heart rate noted  Respiratory: Normal respiratory effort, no problems with respiration noted  Abdomen: Soft, gravid, appropriate for gestational age.  Pain/Pressure: Present     Pelvic: painful Dilation: Fingertip Effacement (%): 50 Station: -3  Extremities: Normal range of motion.  Edema: None  Mental Status: Normal mood and affect. Normal behavior. Normal judgment and thought content.   Assessment and Plan:  Pregnancy: G2P0010 at [redacted]w[redacted]d 1. Chlamydia contact, treated - Test of cure collected today. - Cervicovaginal ancillary only( [place])  2. Supervision of high risk pregnancy, antepartum, third trimester - Recognition of labor discussed.  3.  Thrombocytopenia affecting pregnancy (HCC) - Patient is scheduled for induction on August 4th.  Term labor symptoms and general obstetric precautions including but not limited to vaginal bleeding, contractions, leaking of fluid and fetal movement were reviewed in detail with the patient. Please refer to After Visit Summary for other counseling recommendations.   Return in about 1 week (around 01/28/2020) for [facility]  Future Appointments  Date Time Provider Department Center  01/28/2020  9:15 AM Madlyn Frankel [facility]  02/05/2020  9:15 AM Currie Paris, NP [facility]  02/11/2020  9:15 AM Marvetta Gibbons Brand Males, NP [facility]    Johnny Bridge, MD

## 2020-01-21 NOTE — Patient Instructions (Signed)
First Stage of Labor °Labor is your body's natural process of moving your baby and other structures, including the placenta and umbilical cord, out of your uterus. There are three stages of labor. How long each stage lasts is different for every woman. But certain events happen during each stage that are the same for everyone. °· The first stage starts when true labor begins. This stage ends when your cervix, which is the opening from your uterus into your vagina, is completely open (dilated). °· The second stage begins when your cervix is fully dilated and you start pushing. This stage ends when your baby is born. °· The third stage is the delivery of the organ that nourished your baby during pregnancy (placenta). °First stage of labor °As your due date gets closer, you may start to notice certain physical changes that mean labor is going to start soon. You may feel that your baby has dropped lower into your pelvis. You may experience irregular, often painless, contractions that go away when you walk around or lie down (Braxton Hicks contractions). This is also called false labor. °The first stage of labor begins when you start having contractions that come at regular (evenly spaced) intervals and your cervix starts to get thinner and wider in preparation for your baby to pass through. Birth care providers measure the dilation of your cervix in centimeters (cm). One centimeter is a little less than one-half of an inch. The first stage ends when your cervix is dilated to 10 cm. The first stage of labor is divided into three phases: °· Early phase. °· Active phase. °· Transitional phase. °The length of the first stage of labor varies. It may be longer if this is your first pregnancy. You may spend most of this stage at home trying to relax and stay comfortable. °How does this affect me? °During the first stage of labor, you will move through three phases. °What happens in the early phase? °· You will start to have  regular contractions that last 30-60 seconds. Contractions may come every 5-20 minutes. Keep track of your contractions and call your birth care provider. °· Your water may break during this phase. °· You may notice a clear or slightly bloody discharge of mucus (mucus plug) from your vagina. °· Your cervix will dilate to 3-6 cm. °What happens in the active phase? °The active phase usually lasts 3-5 hours. You may go to the hospital or birth center around this time. During the active phase: °· Your contractions will become stronger, longer, and more uncomfortable. °· Your contractions may last 45-90 seconds and come every 3-5 minutes. °· You may feel lower back pain. °· Your birth care providers may examine your cervix and feel your belly to find the position of your baby. °· You may have a monitor strapped to your belly to measure your contractions and your baby's heart rate. °· You may start using your pain management options. °· Your cervix may be dilated to 6 cm and may start to dilate more quickly. °What happens in the transitional phase? °The transitional phase typically lasts from 30 minutes to 2 hours. At the end of this phase, your cervix will be fully dilated to 10 cm. During the transitional phase: °· Contractions will get stronger and longer. °· Contractions may last 60-90 seconds and come less than 2 minutes apart. °· You may feel hot flashes, chills, or nausea. °How does this affect my baby? °During the first stage of labor, your baby will   gradually move down into your birth canal. °Follow these instructions at home and in the hospital or birth center: ° °· When labor first begins, try to stay calm. You are still in the early phase. If it is night, try to get some sleep. If it is day, try to relax and save your energy. You may want to make some calls and get ready to go to the hospital or birth center. °· When you are in the early phase, try these methods to help ease discomfort: °? Deep breathing and  muscle relaxation. °? Taking a walk. °? Taking a warm bath or shower. °· Drink some fluids and have a light snack if you feel like it. °· Keep track of your contractions. °· Based on the plan you created with your birth care provider, call when your contractions indicate it is time. °· If your water breaks, note the time, color, and odor of the fluid. °· When you are in the active phase, do your breathing exercises and rely on your support people and your team of birth care providers. °Contact a health care provider if: °· Your contractions are strong and regular. °· You have lower back pain or cramping. °· Your water breaks. °· You lose your mucus plug. °Get help right away if you: °· Have a severe headache that does not go away. °· Have changes in your vision. °· Have severe pain in your upper belly. °· Do not feel the baby move. °· Have bright red bleeding. °Summary °· The first stage of labor starts when true labor begins, and it ends when your cervix is dilated to 10 cm. °· The first stage of labor has three phases: early, active, and transitional. °· Your baby moves into the birth canal during the first stage of labor. °· You may have contractions that become stronger and longer. You may also lose your mucus plug and have your water break. °· Call your birth care provider when your contractions are frequent and strong enough to go to the hospital or birth center. °This information is not intended to replace advice given to you by your health care provider. Make sure you discuss any questions you have with your health care provider. °Document Revised: 10/05/2018 Document Reviewed: 08/28/2017 °Elsevier Patient Education © 2020 [employer] ° °

## 2020-01-22 LAB — CERVICOVAGINAL ANCILLARY ONLY
Chlamydia: NEGATIVE
Comment: NEGATIVE
Comment: NORMAL
Neisseria Gonorrhea: NEGATIVE

## 2020-01-28 ENCOUNTER — Other Ambulatory Visit: Payer: Self-pay | Admitting: Student

## 2020-01-28 ENCOUNTER — Other Ambulatory Visit: Payer: Self-pay

## 2020-01-28 ENCOUNTER — Encounter: Payer: Medicaid Other | Admitting: Nurse Practitioner

## 2020-01-28 ENCOUNTER — Ambulatory Visit (INDEPENDENT_AMBULATORY_CARE_PROVIDER_SITE_OTHER): Payer: Medicaid Other | Admitting: Student

## 2020-01-28 VITALS — BP 115/67 | HR 66 | Wt 182.5 lb

## 2020-01-28 DIAGNOSIS — O0993 Supervision of high risk pregnancy, unspecified, third trimester: Secondary | ICD-10-CM

## 2020-01-28 NOTE — Progress Notes (Signed)
IOL scheduled for 02/06/20 in the AM; pt will be called with time to arrive.   Fleet Contras RN 01/28/20

## 2020-01-28 NOTE — Progress Notes (Signed)
° °  PRENATAL VISIT NOTE  Subjective:  Anne Young is a 29 y.o. G2P0010 at [redacted]w[redacted]d being seen today for ongoing prenatal care.  She is currently monitored for the following issues for this low-risk pregnancy and has Supervision of high risk pregnancy, antepartum, third trimester; Group B streptococcal carriage complicating pregnancy; Alpha thalassemia silent carrier; Poor weight gain of pregnancy, unspecified trimester; Anemia in pregnancy; Thrombocytopenia affecting pregnancy (HCC); and Chlamydia contact, treated on their problem list.  Patient reports contractions every 10 minutes. She reports active fetal movements. .  Contractions: Irritability. Vag. Bleeding: None.  Movement: Present. Denies leaking of fluid.   The following portions of the patient's history were reviewed and updated as appropriate: allergies, current medications, past family history, past medical history, past social history, past surgical history and problem list.   Objective:   Vitals:   01/28/20 1040  BP: 115/67  Pulse: 66  Weight: 182 lb 8 oz (82.8 kg)    Fetal Status: Fetal Heart Rate (bpm): 140 Fundal Height: 41 cm Movement: Present     General:  Alert, oriented and cooperative. Patient is in no acute distress.  Skin: Skin is warm and dry. No rash noted.   Cardiovascular: Normal heart rate noted  Respiratory: Normal respiratory effort, no problems with respiration noted  Abdomen: Soft, gravid, appropriate for gestational age.  Pain/Pressure: Present     Pelvic: Cervical exam performed in the presence of a chaperone Dilation: Fingertip Effacement (%): 50 Station: -3  Extremities: Normal range of motion.  Edema: None  Mental Status: Normal mood and affect. Normal behavior. Normal judgment and thought content.   Assessment and Plan:  Pregnancy: G2P0010 at [redacted]w[redacted]d 1. Supervision of normal pregnancy  2. Scheduled IOL on 8/11; explained testing before IOL.  3. Explained method of IOL (cytotec, FB, etc).  4. IOL  orders placed.  5. Chlamydia TOC negative   Term labor symptoms and general obstetric precautions including but not limited to vaginal bleeding, contractions, leaking of fluid and fetal movement were reviewed in detail with the patient. Please refer to After Visit Summary for other counseling recommendations.   No follow-ups on file.  Future Appointments  Date Time Provider Department Center  02/04/2020  9:15 AM [facility]  02/04/2020 10:15 AM Gita Kudo, MD [facility]  02/11/2020  9:15 AM Marvetta Gibbons Brand Males, NP [facility]    Marylene Land, CNM

## 2020-01-29 ENCOUNTER — Telehealth (HOSPITAL_COMMUNITY): Payer: Self-pay | Admitting: *Deleted

## 2020-01-29 ENCOUNTER — Encounter (HOSPITAL_COMMUNITY): Payer: Self-pay | Admitting: *Deleted

## 2020-01-29 NOTE — Telephone Encounter (Signed)
Preadmission screen  

## 2020-01-30 ENCOUNTER — Inpatient Hospital Stay (HOSPITAL_COMMUNITY): Admission: AD | Admit: 2020-01-30 | Payer: Medicaid Other | Source: Home / Self Care

## 2020-02-03 ENCOUNTER — Other Ambulatory Visit: Payer: Self-pay | Admitting: Family Medicine

## 2020-02-04 ENCOUNTER — Other Ambulatory Visit (HOSPITAL_COMMUNITY)
Admission: RE | Admit: 2020-02-04 | Discharge: 2020-02-04 | Disposition: A | Discharge status: Home / Self Care | Payer: Medicaid Other | Source: Ambulatory Visit | Attending: Family Medicine | Admitting: Family Medicine

## 2020-02-04 ENCOUNTER — Ambulatory Visit (INDEPENDENT_AMBULATORY_CARE_PROVIDER_SITE_OTHER): Payer: Medicaid Other | Admitting: *Deleted

## 2020-02-04 ENCOUNTER — Other Ambulatory Visit: Payer: Self-pay

## 2020-02-04 ENCOUNTER — Ambulatory Visit: Payer: Self-pay

## 2020-02-04 ENCOUNTER — Ambulatory Visit (INDEPENDENT_AMBULATORY_CARE_PROVIDER_SITE_OTHER): Payer: Medicaid Other | Admitting: Obstetrics and Gynecology

## 2020-02-04 VITALS — BP 109/63 | HR 80 | Wt 183.5 lb

## 2020-02-04 DIAGNOSIS — Z20822 Contact with and (suspected) exposure to covid-19: Secondary | ICD-10-CM | POA: Diagnosis not present

## 2020-02-04 DIAGNOSIS — O480 Post-term pregnancy: Secondary | ICD-10-CM | POA: Diagnosis not present

## 2020-02-04 DIAGNOSIS — Z3A40 40 weeks gestation of pregnancy: Secondary | ICD-10-CM

## 2020-02-04 DIAGNOSIS — Z01812 Encounter for preprocedural laboratory examination: Secondary | ICD-10-CM | POA: Diagnosis not present

## 2020-02-04 DIAGNOSIS — D696 Thrombocytopenia, unspecified: Secondary | ICD-10-CM

## 2020-02-04 DIAGNOSIS — O0993 Supervision of high risk pregnancy, unspecified, third trimester: Secondary | ICD-10-CM

## 2020-02-04 DIAGNOSIS — O99119 Other diseases of the blood and blood-forming organs and certain disorders involving the immune mechanism complicating pregnancy, unspecified trimester: Secondary | ICD-10-CM

## 2020-02-04 DIAGNOSIS — O409XX0 Polyhydramnios, unspecified trimester, not applicable or unspecified: Secondary | ICD-10-CM

## 2020-02-04 LAB — SARS CORONAVIRUS 2 (TAT 6-24 HRS): SARS Coronavirus 2: NEGATIVE

## 2020-02-04 NOTE — Progress Notes (Signed)
   PRENATAL VISIT NOTE  Subjective:  Anne Young is a 29 y.o. G2P0010 at [redacted]w[redacted]d being seen today for ongoing prenatal care.  She is currently monitored for the following issues for this high-risk pregnancy and has Supervision of high risk pregnancy, antepartum, third trimester; Group B streptococcal carriage complicating pregnancy; Alpha thalassemia silent carrier; Poor weight gain of pregnancy, unspecified trimester; Anemia in pregnancy; Thrombocytopenia affecting pregnancy (HCC); and Chlamydia contact, treated on their problem list.  Patient reports irregular contractions. Has been having them for weeks now.  Contractions: Irregular. Vag. Bleeding: None.  Movement: Present. Denies leaking of fluid.   The following portions of the patient's history were reviewed and updated as appropriate: allergies, current medications, past family history, past medical history, past social history, past surgical history and problem list.   Objective:   Vitals:   02/04/20 0953  BP: 109/63  Pulse: 80  Weight: 183 lb 8 oz (83.2 kg)    Fetal Status: Fetal Heart Rate (bpm): NST   Movement: Present     General:  Alert, oriented and cooperative. Patient is in no acute distress.  Skin: Skin is warm and dry. No rash noted.   Cardiovascular: Normal heart rate noted  Respiratory: Normal respiratory effort, no problems with respiration noted  Abdomen: Soft, gravid, appropriate for gestational age.  Pain/Pressure: Present     Pelvic:  1.5/50/-2; cephalic by sutures   Extremities: Normal range of motion.  Edema: None  Mental Status: Normal mood and affect. Normal behavior. Normal judgment and thought content.   Assessment and Plan:  Pregnancy: G2P0010 at [redacted]w[redacted]d 1. Supervision of high risk pregnancy, antepartum, third trimester -IOL scheduled for 8/11  -BPP completed, reactive NST, AFI of 30  2. [redacted] weeks gestation of pregnancy  3. Thrombocytopenia affecting pregnancy -plt 129 at last check    4.  Polyhydramnios  -discussed moving induction to today but patient declines, will plan for IOL on 8/11 as scheduled   Term labor symptoms and general obstetric precautions including but not limited to vaginal bleeding, contractions, leaking of fluid and fetal movement were reviewed in detail with the patient. Please refer to After Visit Summary for other counseling recommendations.   No follow-ups on file.  Future Appointments  Date Time Provider Department Center  02/04/2020 12:00 PM [facility]-SCREENING [facility] None  02/06/2020  8:10 AM [facility]-LD SCHED ROOM [facility]INDC None    Gita Kudo, MD

## 2020-02-04 NOTE — Progress Notes (Signed)
IOL scheduled on 8/11.

## 2020-02-05 ENCOUNTER — Encounter: Payer: Medicaid Other | Admitting: Student

## 2020-02-05 ENCOUNTER — Encounter: Payer: Medicaid Other | Admitting: Nurse Practitioner

## 2020-02-06 ENCOUNTER — Encounter (HOSPITAL_COMMUNITY): Payer: Self-pay | Admitting: Student

## 2020-02-06 ENCOUNTER — Inpatient Hospital Stay (HOSPITAL_COMMUNITY): Payer: Medicaid Other

## 2020-02-06 ENCOUNTER — Inpatient Hospital Stay (HOSPITAL_COMMUNITY)
Admission: AD | Admit: 2020-02-06 | Discharge: 2020-02-09 | DRG: 806 | Disposition: A | Discharge status: Home / Self Care | Payer: Medicaid Other | Attending: Obstetrics and Gynecology | Admitting: Obstetrics and Gynecology

## 2020-02-06 ENCOUNTER — Other Ambulatory Visit: Payer: Self-pay

## 2020-02-06 DIAGNOSIS — O9912 Other diseases of the blood and blood-forming organs and certain disorders involving the immune mechanism complicating childbirth: Secondary | ICD-10-CM | POA: Diagnosis present

## 2020-02-06 DIAGNOSIS — O2610 Low weight gain in pregnancy, unspecified trimester: Secondary | ICD-10-CM

## 2020-02-06 DIAGNOSIS — D696 Thrombocytopenia, unspecified: Secondary | ICD-10-CM | POA: Diagnosis not present

## 2020-02-06 DIAGNOSIS — D563 Thalassemia minor: Secondary | ICD-10-CM | POA: Diagnosis not present

## 2020-02-06 DIAGNOSIS — O99824 Streptococcus B carrier state complicating childbirth: Secondary | ICD-10-CM | POA: Diagnosis not present

## 2020-02-06 DIAGNOSIS — O480 Post-term pregnancy: Secondary | ICD-10-CM | POA: Diagnosis not present

## 2020-02-06 DIAGNOSIS — O403XX0 Polyhydramnios, third trimester, not applicable or unspecified: Secondary | ICD-10-CM | POA: Diagnosis present

## 2020-02-06 DIAGNOSIS — O99820 Streptococcus B carrier state complicating pregnancy: Secondary | ICD-10-CM

## 2020-02-06 DIAGNOSIS — O0993 Supervision of high risk pregnancy, unspecified, third trimester: Secondary | ICD-10-CM

## 2020-02-06 DIAGNOSIS — Z3A41 41 weeks gestation of pregnancy: Secondary | ICD-10-CM

## 2020-02-06 DIAGNOSIS — O409XX0 Polyhydramnios, unspecified trimester, not applicable or unspecified: Secondary | ICD-10-CM

## 2020-02-06 DIAGNOSIS — O99013 Anemia complicating pregnancy, third trimester: Secondary | ICD-10-CM

## 2020-02-06 LAB — TYPE AND SCREEN
ABO/RH(D): A POS
Antibody Screen: NEGATIVE

## 2020-02-06 LAB — CBC
HCT: 34.4 % — ABNORMAL LOW (ref 36.0–46.0)
Hemoglobin: 11 g/dL — ABNORMAL LOW (ref 12.0–15.0)
MCH: 27 pg (ref 26.0–34.0)
MCHC: 32 g/dL (ref 30.0–36.0)
MCV: 84.5 fL (ref 80.0–100.0)
Platelets: 122 10*3/uL — ABNORMAL LOW (ref 150–400)
RBC: 4.07 MIL/uL (ref 3.87–5.11)
RDW: 15.2 % (ref 11.5–15.5)
WBC: 6.1 10*3/uL (ref 4.0–10.5)
nRBC: 0 % (ref 0.0–0.2)

## 2020-02-06 LAB — OB RESULTS CONSOLE GBS: GBS: POSITIVE

## 2020-02-06 MED ORDER — PENICILLIN G POT IN DEXTROSE 60000 UNIT/ML IV SOLN
3.0000 10*6.[IU] | INTRAVENOUS | Status: DC
  Administered 2020-02-06 – 2020-02-07 (×2): 3 10*6.[IU] via INTRAVENOUS
  Filled 2020-02-06 (×2): qty 50

## 2020-02-06 MED ORDER — OXYTOCIN BOLUS FROM INFUSION
333.0000 mL | Freq: Once | INTRAVENOUS | Status: AC
  Administered 2020-02-07: 333 mL via INTRAVENOUS

## 2020-02-06 MED ORDER — MISOPROSTOL 50MCG HALF TABLET
50.0000 ug | ORAL_TABLET | ORAL | Status: DC | PRN
  Administered 2020-02-06: 50 ug via ORAL
  Filled 2020-02-06: qty 1

## 2020-02-06 MED ORDER — LACTATED RINGERS IV SOLN
500.0000 mL | INTRAVENOUS | Status: DC | PRN
  Administered 2020-02-06 – 2020-02-07 (×3): 500 mL via INTRAVENOUS

## 2020-02-06 MED ORDER — BUTORPHANOL TARTRATE 1 MG/ML IJ SOLN: 2.0000 mg | Freq: Once | INTRAMUSCULAR | Status: AC

## 2020-02-06 MED ORDER — LACTATED RINGERS IV SOLN: INTRAVENOUS | Status: DC

## 2020-02-06 MED ORDER — PROMETHAZINE HCL 25 MG/ML IJ SOLN
12.5000 mg | Freq: Once | INTRAMUSCULAR | Status: AC
  Administered 2020-02-06: 12.5 mg via INTRAVENOUS
  Filled 2020-02-06: qty 1

## 2020-02-06 MED ORDER — SOD CITRATE-CITRIC ACID 500-334 MG/5ML PO SOLN: 30.0000 mL | ORAL | Status: DC | PRN

## 2020-02-06 MED ORDER — ONDANSETRON HCL 4 MG/2ML IJ SOLN
4.0000 mg | Freq: Four times a day (QID) | INTRAMUSCULAR | Status: DC | PRN
  Administered 2020-02-06: 4 mg via INTRAVENOUS
  Filled 2020-02-06: qty 2

## 2020-02-06 MED ORDER — OXYCODONE-ACETAMINOPHEN 5-325 MG PO TABS: 1.0000 | ORAL_TABLET | ORAL | Status: DC | PRN

## 2020-02-06 MED ORDER — SODIUM CHLORIDE 0.9 % IV SOLN
5.0000 10*6.[IU] | Freq: Once | INTRAVENOUS | Status: AC
  Administered 2020-02-06: 5 10*6.[IU] via INTRAVENOUS
  Filled 2020-02-06: qty 5

## 2020-02-06 MED ORDER — ACETAMINOPHEN 325 MG PO TABS: 650.0000 mg | ORAL_TABLET | ORAL | Status: DC | PRN

## 2020-02-06 MED ORDER — OXYCODONE-ACETAMINOPHEN 5-325 MG PO TABS
2.0000 | ORAL_TABLET | ORAL | Status: DC | PRN
  Administered 2020-02-07: 2 via ORAL
  Filled 2020-02-06: qty 2

## 2020-02-06 MED ORDER — MISOPROSTOL 25 MCG QUARTER TABLET: 25.0000 ug | ORAL_TABLET | ORAL | Status: DC | PRN

## 2020-02-06 MED ORDER — LIDOCAINE HCL (PF) 1 % IJ SOLN
30.0000 mL | INTRAMUSCULAR | Status: AC | PRN
  Administered 2020-02-07: 30 mL via SUBCUTANEOUS
  Filled 2020-02-06: qty 30

## 2020-02-06 MED ORDER — OXYTOCIN-SODIUM CHLORIDE 30-0.9 UT/500ML-% IV SOLN
2.5000 [IU]/h | INTRAVENOUS | Status: DC
  Administered 2020-02-07: 2.5 [IU]/h via INTRAVENOUS
  Filled 2020-02-06: qty 500

## 2020-02-06 MED ORDER — BUTORPHANOL TARTRATE 1 MG/ML IJ SOLN
INTRAMUSCULAR | Status: AC
  Administered 2020-02-06: 2 mg via INTRAVENOUS
  Filled 2020-02-06: qty 2

## 2020-02-06 MED ORDER — TERBUTALINE SULFATE 1 MG/ML IJ SOLN
0.2500 mg | Freq: Once | INTRAMUSCULAR | Status: AC | PRN
  Administered 2020-02-06: 0.25 mg via SUBCUTANEOUS
  Filled 2020-02-06: qty 1

## 2020-02-06 NOTE — Progress Notes (Signed)
Patient ID: Lorel Lembo, female   DOB: 08-01-1990, 29 y.o.   MRN: [id number]  In to meet pt at 2130 when she was s/p cytotec x 1 dose 3.5hrs ago; requested cx exam at 2200; feeling ctx as moderate, breathing with them; hesitant about cervical foley; PCN x 2 doses  BP 128/59, P 70 FHR 135-140, +accels, occ variables Ctx q 1-2 mins Cx 1/70/vtx -2  IUP@41 .0wks Cx unfavorable GBS pos  Discussed options of cervical foley vs expectant from this point, as she is having too frequent/painful ctx to repeat cytotec currently; she requests to have a cx exam in 2-4hrs and to try nitrous for pain currently  Arabella Merles CNM 02/06/2020 10:22 PM

## 2020-02-06 NOTE — H&P (Signed)
OBSTETRIC ADMISSION HISTORY AND PHYSICAL  Anne Young is a 29 y.o. female G2P0010 with IUP at 10w0dby LMP presenting for IOL for late term. She reports +FMs, No LOF, no VB, no blurry vision, headaches or peripheral edema, and RUQ pain.  She plans on breast feeding. She request nexplanon for birth control. [facility]  Dating: By LMP c/b 8wk6d US--->  Estimated Date of Delivery: 01/30/20  Sono:    _0 , CWD, normal anatomy, cephalic presentation, [id number] 84%ile EFW   Prenatal History/Complications:    Polyhydramnios: diagnosed on 02/04/20 at 486w5d AFI 31.5   Thrombocytopenia w plt 122 on admission   Chlamydia infection in third trimester, TOC negative for infection     Past Medical History: Past Medical History:  Diagnosis Date  . BV (bacterial vaginosis)   . Medical history non-contributory   . Yeast vaginitis     Past Surgical History: Past Surgical History:  Procedure Laterality Date  . NO PAST SURGERIES      Obstetrical History: OB History    Gravida  2   Para      Term      Preterm      AB  1   Living  0     SAB      TAB      Ectopic      Multiple      Live Births              Social History Social History   Socioeconomic History  . Marital status: Single    Spouse name: Not on file  . Number of children: Not on file  . Years of education: Not on file  . Highest education level: Not on file  Occupational History  . Not on file  Tobacco Use  . Smoking status: Never Smoker  . Smokeless tobacco: Never Used  Vaping Use  . Vaping Use: Never used  Substance and Sexual Activity  . Alcohol use: Not Currently    Comment: occasionally  . Drug use: Never  . Sexual activity: Yes    Birth control/protection: None  Other Topics Concern  . Not on file  Social History Narrative  . Not on file   Social Determinants of Health   Financial Resource Strain:   . Difficulty of Paying Living Expenses:   Food  Insecurity: No Food Insecurity  . Worried About RuCharity fundraisern the Last Year: Never true  . Ran Out of Food in the Last Year: Never true  Transportation Needs: No Transportation Needs  . Lack of Transportation (Medical): No  . Lack of Transportation (Non-Medical): No  Physical Activity:   . Days of Exercise per Week:   . Minutes of Exercise per Session:   Stress:   . Feeling of Stress :   Social Connections:   . Frequency of Communication with Friends and Family:   . Frequency of Social Gatherings with Friends and Family:   . Attends Religious Services:   . Active Member of Clubs or Organizations:   . Attends ClArchivisteetings:   . Marland Kitchenarital Status:     Family History: History reviewed. No pertinent family history.  Allergies: No Known Allergies  Medications Prior to Admission  Medication Sig Dispense Refill Last Dose  . acetaminophen (TYLENOL) 500 MG tablet Take 2 tablets (1,000 mg total) by mouth every 6 (six) hours as needed for moderate pain. (Patient not taking: Reported on 02/04/2020)  30 tablet 1   . Blood Pressure Monitoring (BLOOD PRESSURE KIT) DEVI 1 Device by Does not apply route as needed. 1 each 0   . Elastic Bandages & Supports (COMFORT FIT MATERNITY SUPP SM) MISC 1 Units by Does not apply route daily as needed. (Patient not taking: Reported on 12/03/2019) 1 each 0   . ferrous sulfate 325 (65 FE) MG tablet Take 1 tablet (325 mg total) by mouth daily. 30 tablet 3   . Nutritional Supplements (BOOST MAX PROTEIN) LIQD Take 1 Container by mouth in the morning and at bedtime. 60 mL 3   . Prenat w/o A Vit-FeFum-FePo-FA (CONCEPT OB) 130-92.4-1 MG CAPS Take 1 tablet by mouth daily. 30 capsule 12      Review of Systems   All systems reviewed and negative except as stated in HPI  Blood pressure 118/69, pulse 87, temperature 98.3 F (36.8 C), temperature source Oral, resp. rate 16, height _0  (1.727 m), weight 83.3 kg, last menstrual period  04/30/2019. General appearance: alert and appears stated age Lungs: clear to auscultation bilaterally Heart: regular rate and rhythm Abdomen: soft, non-tender; bowel sounds normal Pelvic: 6/22/-2, cephalic by suture  Extremities: Homans sign is negative, no sign of DVT Presentation: cephalic Fetal monitoringBaseline: 150 bpm Mod variability, Pos accels, neg decels  Uterine activityFrequency: q5-69mn   Dilation: 1 Effacement (%): 50 Station: -3 Exam by:: Zyionna Pesce MD   Prenatal labs: ABO, Rh: --/--/A POS (08/11 1705) Antibody: NEG (08/11 1705) Rubella: 11.10 (01/26 1118) RPR: Non Reactive (05/18 0903)  HBsAg: Negative (01/26 1118)  HIV: Non Reactive (05/18 0903)  GBS:    2 hr Glucola normal  Genetic screening  Low risk Anatomy UKoreanormal   Prenatal Transfer Tool  Maternal Diabetes: No Genetic Screening: Normal Maternal Ultrasounds/Referrals: Normal Fetal Ultrasounds or other Referrals:  None Maternal Substance Abuse:  No Significant Maternal Medications:  None Significant Maternal Lab Results: Group B Strep positive  Results for orders placed or performed during the hospital encounter of 02/06/20 (from the past 24 hour(s))  Type and screen   Collection Time: 02/06/20  5:05 PM  Result Value Ref Range   ABO/RH(D) A POS    Antibody Screen NEG    Sample Expiration      02/09/2020,2359 Performed at [facility] [address], [place] [address]  CBC   Collection Time: 02/06/20  5:25 PM  Result Value Ref Range   WBC 6.1 4.0 - 10.5 K/uL   RBC 4.07 3.87 - 5.11 MIL/uL   Hemoglobin 11.0 (L) 12.0 - 15.0 g/dL   HCT 34.4 (L) 36 - 46 %   MCV 84.5 80.0 - 100.0 fL   MCH 27.0 26.0 - 34.0 pg   MCHC 32.0 30.0 - 36.0 g/dL   RDW 15.2 11.5 - 15.5 %   Platelets 122 (L) 150 - 400 K/uL   nRBC 0.0 0.0 - 0.2 %    Patient Active Problem List   Diagnosis Date Noted  . Indication for care or intervention in labor or delivery 02/06/2020  . Chlamydia contact, treated  01/21/2020  . Anemia in pregnancy 11/14/2019  . Thrombocytopenia affecting pregnancy ([place] 11/14/2019  . Poor weight gain of pregnancy, unspecified trimester 09/21/2019  . Alpha thalassemia silent carrier 08/07/2019  . Group B streptococcal carriage complicating pregnancy 021/19/4174 . Supervision of high risk pregnancy, antepartum, third trimester 07/18/2019    Assessment/Plan:  Anne Munleyis a 29y.o. G2P0010 at 430w0dere for IOL for  late term.   #Induction of Labor: Starting SVE: 1/50/-3, discussed induction process with patient who is adamantly declining foley balloon. Will proceed w buccal cytotec.   #Pain: Undecided on epidural, can have prn  #FWB: Cat I  #ID:  POS will need PCN in labor   #MOF: Breast #MOC: desires nexplanon   Janet Berlin, MD  02/06/2020, 6:41 PM

## 2020-02-07 ENCOUNTER — Inpatient Hospital Stay (HOSPITAL_COMMUNITY): Payer: Medicaid Other | Admitting: Anesthesiology

## 2020-02-07 ENCOUNTER — Encounter (HOSPITAL_COMMUNITY): Payer: Self-pay | Admitting: Student

## 2020-02-07 DIAGNOSIS — O409XX0 Polyhydramnios, unspecified trimester, not applicable or unspecified: Secondary | ICD-10-CM | POA: Diagnosis present

## 2020-02-07 DIAGNOSIS — Z3A41 41 weeks gestation of pregnancy: Secondary | ICD-10-CM | POA: Diagnosis not present

## 2020-02-07 DIAGNOSIS — O480 Post-term pregnancy: Secondary | ICD-10-CM | POA: Diagnosis not present

## 2020-02-07 DIAGNOSIS — O99824 Streptococcus B carrier state complicating childbirth: Secondary | ICD-10-CM | POA: Diagnosis not present

## 2020-02-07 LAB — RPR: RPR Ser Ql: NONREACTIVE

## 2020-02-07 MED ORDER — PHENYLEPHRINE 40 MCG/ML (10ML) SYRINGE FOR IV PUSH (FOR BLOOD PRESSURE SUPPORT): 80.0000 ug | PREFILLED_SYRINGE | INTRAVENOUS | Status: DC | PRN

## 2020-02-07 MED ORDER — MEASLES, MUMPS & RUBELLA VAC IJ SOLR: 0.5000 mL | Freq: Once | INTRAMUSCULAR | Status: DC

## 2020-02-07 MED ORDER — SIMETHICONE 80 MG PO CHEW: 80.0000 mg | CHEWABLE_TABLET | ORAL | Status: DC | PRN

## 2020-02-07 MED ORDER — LACTATED RINGERS IV SOLN: 500.0000 mL | Freq: Once | INTRAVENOUS | Status: DC

## 2020-02-07 MED ORDER — WITCH HAZEL-GLYCERIN EX PADS: 1.0000 "application " | MEDICATED_PAD | CUTANEOUS | Status: DC | PRN

## 2020-02-07 MED ORDER — TETANUS-DIPHTH-ACELL PERTUSSIS 5-2.5-18.5 LF-MCG/0.5 IM SUSP: 0.5000 mL | Freq: Once | INTRAMUSCULAR | Status: DC

## 2020-02-07 MED ORDER — ONDANSETRON HCL 4 MG PO TABS: 4.0000 mg | ORAL_TABLET | ORAL | Status: DC | PRN

## 2020-02-07 MED ORDER — PRENATAL MULTIVITAMIN CH
1.0000 | ORAL_TABLET | Freq: Every day | ORAL | Status: DC
  Administered 2020-02-07 – 2020-02-09 (×3): 1 via ORAL
  Filled 2020-02-07 (×3): qty 1

## 2020-02-07 MED ORDER — DIPHENHYDRAMINE HCL 25 MG PO CAPS: 25.0000 mg | ORAL_CAPSULE | Freq: Four times a day (QID) | ORAL | Status: DC | PRN

## 2020-02-07 MED ORDER — DIBUCAINE (PERIANAL) 1 % EX OINT: 1.0000 "application " | TOPICAL_OINTMENT | CUTANEOUS | Status: DC | PRN

## 2020-02-07 MED ORDER — ONDANSETRON HCL 4 MG/2ML IJ SOLN: 4.0000 mg | INTRAMUSCULAR | Status: DC | PRN

## 2020-02-07 MED ORDER — ZOLPIDEM TARTRATE 5 MG PO TABS: 5.0000 mg | ORAL_TABLET | Freq: Every evening | ORAL | Status: DC | PRN

## 2020-02-07 MED ORDER — ACETAMINOPHEN 325 MG PO TABS
650.0000 mg | ORAL_TABLET | ORAL | Status: DC | PRN
  Administered 2020-02-08 (×2): 650 mg via ORAL
  Filled 2020-02-07 (×2): qty 2

## 2020-02-07 MED ORDER — IBUPROFEN 600 MG PO TABS
600.0000 mg | ORAL_TABLET | Freq: Four times a day (QID) | ORAL | Status: DC
  Administered 2020-02-07 – 2020-02-09 (×8): 600 mg via ORAL
  Filled 2020-02-07 (×11): qty 1

## 2020-02-07 MED ORDER — OXYCODONE HCL 5 MG PO TABS: 5.0000 mg | ORAL_TABLET | ORAL | Status: DC | PRN

## 2020-02-07 MED ORDER — FENTANYL CITRATE (PF) 100 MCG/2ML IJ SOLN
INTRAMUSCULAR | Status: AC
  Filled 2020-02-07: qty 2

## 2020-02-07 MED ORDER — EPHEDRINE 5 MG/ML INJ: 10.0000 mg | INTRAVENOUS | Status: DC | PRN

## 2020-02-07 MED ORDER — FENTANYL-BUPIVACAINE-NACL 0.5-0.125-0.9 MG/250ML-% EP SOLN
12.0000 mL/h | EPIDURAL | Status: DC | PRN
  Filled 2020-02-07: qty 250

## 2020-02-07 MED ORDER — FENTANYL CITRATE (PF) 100 MCG/2ML IJ SOLN
100.0000 ug | INTRAMUSCULAR | Status: DC | PRN
  Administered 2020-02-07 (×2): 100 ug via INTRAVENOUS
  Filled 2020-02-07: qty 2

## 2020-02-07 MED ORDER — COCONUT OIL OIL
1.0000 "application " | TOPICAL_OIL | Status: DC | PRN
  Administered 2020-02-08: 1 via TOPICAL

## 2020-02-07 MED ORDER — BENZOCAINE-MENTHOL 20-0.5 % EX AERO
1.0000 "application " | INHALATION_SPRAY | CUTANEOUS | Status: DC | PRN
  Administered 2020-02-07: 1 via TOPICAL
  Filled 2020-02-07: qty 56

## 2020-02-07 MED ORDER — DIPHENHYDRAMINE HCL 50 MG/ML IJ SOLN: 12.5000 mg | INTRAMUSCULAR | Status: DC | PRN

## 2020-02-07 MED ORDER — SENNOSIDES-DOCUSATE SODIUM 8.6-50 MG PO TABS
2.0000 | ORAL_TABLET | ORAL | Status: DC
  Administered 2020-02-07 – 2020-02-09 (×2): 2 via ORAL
  Filled 2020-02-07 (×2): qty 2

## 2020-02-07 NOTE — Lactation Note (Signed)
This note was copied from a baby's chart. Lactation Consultation Note  Patient Name: Anne Young Date: 02/07/2020 Reason for consult: Initial assessment;[place] Mom is a G1P1 baby Anne Summerall now 40 hours old.  Mom reports she has not been breastfeeding well.  Mom reports she went 5 hours earlier and mom reports that she was concerned about her not eating so she gave formula. Mom reports she took a breastfeeding class and plans to stay home.  Mom reports she does not have an electric breastpump for home use.  RN had given her a manual pump earlier.  Mom used manual pump while infant was getting a bath.   Infant STS on arrival post bath. Assisted mom in breastfeeding on left breast in cross cradle hold.  Mom reports comfort.  Infant breastfed well with rythmic sucking and intermittent swallows.  Infant let go, nipple round and elongated.  Attempt to relatch infant.  Infant would not latch.  Spoon fed infant 8 ml of colostrum that mom had pumped while infant getting bath. Infant fell asleep.  Left STS.  Reviewed Understanding Mother and Baby and reviewed and [facility] breastfeeding consultation Sheet.  Urged mom to call lactation as needed.   Maternal Data Formula Feeding for Exclusion: No Has patient been taught Hand Expression?: Yes Does the patient have breastfeeding experience prior to this delivery?: No  Feeding Feeding Type: Breast Fed  LATCH Score Latch: Grasps breast easily, tongue down, lips flanged, rhythmical sucking.  Audible Swallowing: A few with stimulation  Type of Nipple: Everted at rest and after stimulation  Comfort (Breast/Nipple): Soft / non-tender  Hold (Positioning): Assistance needed to correctly position infant at breast and maintain latch.  LATCH Score: 8  Interventions Interventions: Breast feeding basics reviewed;Assisted with latch;Breast massage;Hand express;Expressed milk;Hand pump  Lactation Tools Discussed/Used     Consult  Status Consult Status: Follow-up Date: 02/08/20 Follow-up type: In-patient    [facility] Anne Young 02/07/2020, 9:38 PM

## 2020-02-07 NOTE — Discharge Summary (Signed)
Postpartum Discharge Summary     Patient Name: Anne Young DOB: 16-Feb-1991 MRN: [id number]  Date of admission: 02/06/2020 Delivery date:02/07/2020  Delivering provider: Serita Grammes D  Date of discharge: 02/09/2020  Admitting diagnosis: Indication for care or intervention in labor or delivery [O75.9] Intrauterine pregnancy: [redacted]w[redacted]d    Secondary diagnosis:  Active Problems:   Group B streptococcal carriage complicating pregnancy   Alpha thalassemia silent carrier   Thrombocytopenia affecting pregnancy ([place]   Polyhydramnios   Post term pregnancy at [redacted] weeks gestation  Additional problems: none    Discharge diagnosis: Term Pregnancy Delivered and Thrombocytopenia                                              Post partum procedures: declined birth control in hospital. Still considering options.  Augmentation: Cytotec Complications: None  Hospital course: Induction of Labor With Vaginal Delivery   29y.o. yo G2P0010 at 453w1das admitted to the hospital 02/06/2020 for induction of labor.  Indication for induction: Postdates. She was also noted to have polyhydramnios on 8/9 with an AFI of 31cm. Patient had an uncomplicated labor course, including a single dose of buccal cytotec which she progressed to delivery approx 10h afterwards. Her platelets were stable on admission at 122.  Membrane Rupture Time/Date: 12:52 AM ,02/07/2020   Delivery Method:Vaginal, Spontaneous  Episiotomy: None  Lacerations:  Perineal;1st degree;Periurethral  Details of delivery can be found in separate delivery note.  Patient had a routine postpartum course. Patient is discharged home 02/09/20.  Newborn Data: Birth date:02/07/2020  Birth time:3:58 AM  Gender:Female  Living status:Living  Apgars:8 ,9  Weight:3779 g  3779gm (8lb 5.3oz)  Magnesium Sulfate received: No BMZ received: No Rhophylac:N/A MMR:N/A T-DaP:Given prenatally Flu: Yes Transfusion:No  Physical exam  Vitals:   02/08/20 0420 02/08/20 1308  02/08/20 2015 02/09/20 0600  BP: 101/63 105/65 115/72 110/72  Pulse: 74 74 (!) 59 74  Resp: 18 16    Temp: 98.2 F (36.8 C) 98.5 F (36.9 C) 98.8 F (37.1 C)   TempSrc: Oral Axillary Oral   SpO2: 100% 99%    Weight:      Height:       General: alert, cooperative and no distress Lochia: appropriate Uterine Fundus: firm Incision: N/A DVT Evaluation: No evidence of DVT seen on physical exam. Labs: Lab Results  Component Value Date   WBC 6.1 02/06/2020   HGB 11.0 (L) 02/06/2020   HCT 34.4 (L) 02/06/2020   MCV 84.5 02/06/2020   PLT 122 (L) 02/06/2020   No flowsheet data found. Edinburgh Score: Edinburgh Postnatal Depression Scale Screening Tool 02/07/2020  I have been able to laugh and see the funny side of things. 0  I have looked forward with enjoyment to things. 0  I have blamed myself unnecessarily when things went wrong. 1  I have been anxious or worried for no good reason. 2  I have felt scared or panicky for no good reason. 0  Things have been getting on top of me. 0  I have been so unhappy that I have had difficulty sleeping. 0  I have felt sad or miserable. 0  I have been so unhappy that I have been crying. 0  The thought of harming myself has occurred to me. 0  Edinburgh Postnatal Depression Scale Total 3     After visit meds:  Allergies as of 02/09/2020   No Known Allergies     Medication List    TAKE these medications   acetaminophen 325 MG tablet Commonly known as: Tylenol Take 2 tablets (650 mg total) by mouth every 6 (six) hours. What changed:   medication strength  how much to take  when to take this  reasons to take this   Blood Pressure Kit Devi 1 Device by Does not apply route as needed.   Boost Max Protein Liqd Take 1 Container by mouth in the morning and at bedtime.   Comfort Fit Maternity Supp Sm Misc 1 Units by Does not apply route daily as needed.   Concept OB 130-92.4-1 MG Caps Take 1 tablet by mouth daily.   docusate  sodium 100 MG capsule Commonly known as: Colace Take 1 capsule (100 mg total) by mouth 2 (two) times daily as needed for mild constipation. To maintain soft stools   ferrous sulfate 325 (65 FE) MG tablet Take 1 tablet (325 mg total) by mouth daily.   ibuprofen 600 MG tablet Commonly known as: ADVIL Take 1 tablet (600 mg total) by mouth every 6 (six) hours as needed.   polyethylene glycol 17 g packet Commonly known as: MiraLax Take 17 g by mouth daily as needed for moderate constipation.        Discharge home in stable condition Infant Feeding: Bottle and Breast Infant Disposition:home with mother Discharge instruction: per After Visit Summary and Postpartum booklet. Activity: Advance as tolerated. Pelvic rest for 6 weeks.  Diet: routine diet Future Appointments: Future Appointments  Date Time Provider [place]  03/06/2020  1:55 PM Cherre Blanc, MD [facility]   Follow up Visit:  Myrtis Ser, CNM  P [facility] Admin Pool Please schedule this patient for Postpartum visit in: 4 weeks with the following provider: Any provider  In-Person  For C/S patients schedule nurse incision check in weeks 2 weeks: no  Low risk pregnancy complicated by: postdates, polyhydramnios  Delivery mode: SVD  Anticipated Birth Control: Nexplanon in clinic PP Procedures needed: none  Schedule Integrated [place] visit: no   02/09/2020 Randa Ngo, MD  Attestation of Supervision of Student:  I confirm that I have verified the information documented in the resident's note and that I have also personally reperformed the history, physical exam and all medical decision making activities.  I have verified that all services and findings are accurately documented in this student's note; and I agree with management and plan as outlined in the documentation. I have also made any necessary editorial changes.  Randa Ngo, [place] for [facility], [facility] 02/09/2020  10:07 AM

## 2020-02-07 NOTE — Progress Notes (Addendum)
Patient ID: Anne Young, female   DOB: 20-Aug-1990, 29 y.o.   MRN: [id number]   Pt had desired epidural placement, but was found to be complete when she had the urge to push; PCN x 3 doses  VSS, afebrile FHR 120s, +accels, occ variables Ctx q 2 mins Cx C/C/vtx +2  IUP@term  End 1st stage GBS pos  Begin pushing w/ ctx Anticipate vag del  Anne Young [facility] 02/07/2020 3:48 AM

## 2020-02-07 NOTE — Progress Notes (Signed)
Patient ID: Anne Young, female   DOB: Oct 14, 1990, 29 y.o.   MRN: [id number]  Have held additional cytotec doses due to freq ctx/discomfort; s/p SROM for light MSF at 0100; has had IV pain meds x 2 and is now requesting an epidural  BP 120/68, P 79 FHR 120-130s, +accels, early variables Ctx q 1-2 mins Cx per RN 1+/80/vtx -3  IUP@term  Cx unfavorable GBS neg  Plan to have epidural placed, and then will discuss cervical foley once she's comfortable  Arabella Merles CNM 02/07/2020 3:11 AM

## 2020-02-07 NOTE — Discharge Instructions (Signed)

## 2020-02-08 NOTE — Progress Notes (Signed)
Post Partum Day 1  Subjective:  Anne Young is a 29 y.o. G2P1011 [redacted]w[redacted]d s/p SVD.  No acute events overnight.  Pt denies problems with ambulating, voiding or po intake.  She denies nausea or vomiting.  Pain is well controlled.  She has had flatus. She has not had bowel movement.  Lochia Moderate.  Plan for birth control is no method, was initially planning on nexplanon but would like to hold off for now..  Method of Feeding: breast  Objective: BP 101/63 (BP Location: Right Arm)   Pulse 74   Temp 98.2 F (36.8 C) (Oral)   Resp 18   Ht 5\' 8"  (1.727 m)   Wt 83.3 kg   LMP 04/30/2019 (Exact Date)   SpO2 100%   Breastfeeding Unknown   BMI 27.92 kg/m   Physical Exam:  General: alert, cooperative and no distress Lochia:normal flow Chest: CTAB Heart: RRR no m/r/g Abdomen: +BS, soft, nontender, fundus firm at/below umbilicus Uterine Fundus: firm, at the level of the umbilicus DVT Evaluation: No evidence of DVT seen on physical exam. Extremities: no edema  Recent Labs    02/06/20 1725  HGB 11.0*  HCT 34.4*    Assessment/Plan:  ASSESSMENT: Anne Young is a 29 y.o. G2P1011 [redacted]w[redacted]d ppd #1 s/p NSVD doing well.   Plan for discharge tomorrow, Breastfeeding, Lactation consult and Contraception No plan for contraception right now.   LOS: 2 days   [redacted]w[redacted]d 02/08/2020, 7:50 AM

## 2020-02-08 NOTE — Lactation Note (Addendum)
This note was copied from a baby's chart. Lactation Consultation Note  Patient Name: Anne Young [id number] Date: 02/08/2020 Reason for consult: Follow-up assessment;Difficult latch Baby 41hrs old, wt loss 4%. Mom sitting in bed, dad holding baby, states was unable to have hearing screening d/t being awake. Mom agreed to latch baby to breast. Baby calm, giving subtle feeding cues, mom latched baby shallow to left breast football hold, reports painful, latch broken, LC assisted latching deeply, breast tissue movement noted, frequent audible swallows noted, mom reports comfort with this latch. Reinforced cue based feedings, 8-12 in 24hrs, cluster feeding, engorgement and how to manage. Advised to call if assistance needed with next latch. Mom voiced understanding and with no further concerns. Left the room with baby still latched ~31min mark BGilliam, RN, IBCLC  Maternal Data    Feeding Feeding Type: Breast Milk  LATCH Score Latch: Grasps breast easily, tongue down, lips flanged, rhythmical sucking.  Audible Swallowing: Spontaneous and intermittent  Type of Nipple: Flat  Comfort (Breast/Nipple): Filling, red/small blisters or bruises, mild/mod discomfort  Hold (Positioning): Assistance needed to correctly position infant at breast and maintain latch.  LATCH Score: 7  Interventions Interventions: Assisted with latch;Breast compression;Adjust position;Support pillows;Position options;Comfort gels  Lactation Tools Discussed/Used     Consult Status Consult Status: Follow-up Date: 02/09/20 Follow-up type: In-patient    Anne Young 02/08/2020, 3:26 PM

## 2020-02-08 NOTE — Lactation Note (Signed)
This note was copied from a baby's chart. Lactation Consultation Note  Patient Name: Anne Young WYBRK'[id number] Date: 02/08/2020 Reason for consult: Follow-up assessment Baby 33hrs old, mom sitting in bed, baby asleep in bassinet, dad at bedside. Mom states breastfeeding is going so-so, states baby latches well at times, last feeding was at 1230p, baby did not latch, reports pumping and offering EBM only. Mom agreed to call [facility] for the next feeding to assess latch. BGilliam, RN, IBCLC  Maternal Data    Feeding Feeding Type: Breast Milk  LATCH Score  Interventions    Lactation Tools Discussed/Used     Consult Status Consult Status: Follow-up Date: 02/08/20 Follow-up type: In-patient    [name] 02/08/2020, 3:23 PM

## 2020-02-09 MED ORDER — POLYETHYLENE GLYCOL 3350 17 G PO PACK: 17.0000 g | PACK | Freq: Every day | ORAL | 0 refills | Status: DC | PRN

## 2020-02-09 MED ORDER — ACETAMINOPHEN 325 MG PO TABS: 650.0000 mg | ORAL_TABLET | Freq: Four times a day (QID) | ORAL | Status: DC

## 2020-02-09 MED ORDER — IBUPROFEN 600 MG PO TABS: 600.0000 mg | ORAL_TABLET | Freq: Four times a day (QID) | ORAL | 0 refills | Status: DC | PRN

## 2020-02-09 MED ORDER — DOCUSATE SODIUM 100 MG PO CAPS
100.0000 mg | ORAL_CAPSULE | Freq: Two times a day (BID) | ORAL | 2 refills | Status: DC | PRN
Start: 2020-02-09 — End: 2020-10-31

## 2020-02-09 NOTE — Lactation Note (Signed)
This note was copied from a baby's chart. Lactation Consultation Note  Patient Name: Anne Young ATFTD'D Date: 02/09/2020 Reason for consult: Follow-up assessment   P1, Baby 53 hours old and sleeping. Answered questions regarding cluster feeding, frequency and length of feedings. Feed on demand with cues.  Goal 8-12+ times per day after first 24 hrs.  Place baby STS if not cueing.  Stools transitioning to brown. Observed latch and assisted with breast compression and increased depth in football hold. Reviewed waking techniques. Reviewed engorgement care and monitoring voids/stools. Mother has manual pump and understands how to use. Reviewed comfort gel use.    Maternal Data Has patient been taught Hand Expression?: Yes  Feeding Feeding Type: Breast Fed  LATCH Score Latch: Grasps breast easily, tongue down, lips flanged, rhythmical sucking.  Audible Swallowing: A few with stimulation  Type of Nipple: Everted at rest and after stimulation  Comfort (Breast/Nipple): Soft / non-tender  Hold (Positioning): Assistance needed to correctly position infant at breast and maintain latch.  LATCH Score: 8  Interventions Interventions: Breast feeding basics reviewed;Comfort gels;Coconut oil;Hand express;Assisted with latch;Breast compression  Lactation Tools Discussed/Used     Consult Status Consult Status: Complete Date: 02/09/20 Follow-up type: In-patient    Dahlia Byes [facility] 02/09/2020, 9:02 AM

## 2020-02-11 ENCOUNTER — Encounter: Payer: Medicaid Other | Admitting: Nurse Practitioner

## 2020-02-11 ENCOUNTER — Telehealth: Payer: Self-pay | Admitting: *Deleted

## 2020-02-11 NOTE — Telephone Encounter (Signed)
Contacted patient to complete transition of care assessment:  Transition Care Management Follow-up Telephone Call  . Medicaid Managed Care Transition Call Status:MM [facility] Call Made  . Date of discharge and from where: [facility], 02/09/20  . How have you been since you were released from the hospital? "ok"  . Any questions or concerns? No  Items Reviewed: Marland Kitchen Did the pt receive and understand the discharge instructions provided? Yes  . Medications obtained and verified? Yes  . Any new allergies since your discharge? No  . Dietary orders reviewed? Yes . Do you have support at home? Yes, Family  Functional Questionnaire: (I = Independent and D = Dependent)  ADLs: Independent Bathing/Dressing:Independent Meal Prep: Independent Eating: Independent Maintaining continence: Independent Transferring/Ambulation: Independent Managing Meds: Independent   Follow up appointments reviewed:  PCP Hospital f/u appt confirmed? No patient would like to be assigned a PCP  Specialist Hospital f/u appt confirmed?  Scheduled to see Dr Raynelle Dick on 03/06/20 @ 1355  Are transportation arrangements needed? No   If their condition worsens, is the pt aware to call PCP or go to the EmergencyDept.? Yes  Was the patient provided with contact information for the PCP's office or ED? Yes  Was to pt encouraged to call back with questions or concerns? Yes  Burnard Bunting, RN, BSN, CCRN Patient Engagement Center [phone]

## 2020-03-06 ENCOUNTER — Encounter: Payer: Self-pay | Admitting: Obstetrics & Gynecology

## 2020-03-06 ENCOUNTER — Ambulatory Visit (INDEPENDENT_AMBULATORY_CARE_PROVIDER_SITE_OTHER): Payer: Medicaid Other | Admitting: Obstetrics & Gynecology

## 2020-03-06 ENCOUNTER — Other Ambulatory Visit: Payer: Self-pay

## 2020-03-06 ENCOUNTER — Encounter: Payer: Self-pay | Admitting: General Practice

## 2020-03-06 NOTE — Progress Notes (Signed)
    Post Partum Visit Note  Anne Young is a 29 y.o. G41P1011 female who presents for a postpartum visit. She is 4 weeks postpartum following a SVD.  I have fully reviewed the prenatal and intrapartum course. The delivery was at 41.1 gestational weeks.  Anesthesia: lidocaine. Postpartum course has been unrmarkable. Baby is doing well Baby is feeding by both breast and bottle - gerber. Bleeding no bleeding. Bowel function is normal. Bladder function is normal. Patient is not sexually active. Contraception method is none. Postpartum depression screening:Negative   The pregnancy intention screening data noted above was reviewed. Potential methods of contraception were discussed. The patient elected to proceed with Abstinence.      The following portions of the patient's history were reviewed and updated as appropriate: allergies, current medications, past family history, past medical history, past social history, past surgical history and problem list.  Review of Systems Pertinent items are noted in HPI. Pertinent items noted in HPI and remainder of comprehensive ROS otherwise negative.    Objective:  unknown if currently breastfeeding.  General:  alert and cooperative   Breasts:  negative  Lungs: clear to auscultation bilaterally  Heart:  regular rate and rhythm, S1, S2 normal, no murmur, click, rub or gallop  Abdomen: soft, non-tender; bowel sounds normal; no masses,  no organomegaly   Vulva:  not evaluated  Vagina: not evaluated        Assessment:    29yo G2P1011 here for postpartum exam. Pap smear not done at today's visit.   Plan:   Essential components of care per ACOG recommendations:  1.  Mood and well being: Patient with negative depression screening today. Reviewed local resources for support.  - Patient does not use tobacco.  - hx of drug use? No    2. Infant care and feeding:  -Patient currently breastmilk feeding? Yes  breastmilk feeding discussed return to work and  pumping. If needed, patient was provided letter for work to allow for every 2-3 hr pumping breaks, and to be granted a private location to express breastmilk and refrigerated area to store breastmilk. Reviewed importance of draining breast regularly to support lactation.  3. Sexuality, contraception and birth spacing - Patient does not want a pregnancy in the next year.  Desired family size is 2 children.  - Reviewed forms of contraception in tiered fashion. Patient desired abstinence today.   - Discussed birth spacing of 18 months  4. Sleep and fatigue -Encouraged family/partner/community support of 4 hrs of uninterrupted sleep to help with mood and fatigue  5. Physical Recovery  - Discussed patients delivery and complications - Patient has urinary incontinence? No  - Patient is safe to resume physical and sexual activity   Malachy Chamber, MD [facility] [employer], [facility]

## 2020-06-09 IMAGING — US US MFM OB FOLLOW-UP
1 series · 14 of 28 positions shown · non-contrast
Comparison: none

[Series 1: us mfm ob follow-up · 14 of 53 slices shown]
[im 2/53]
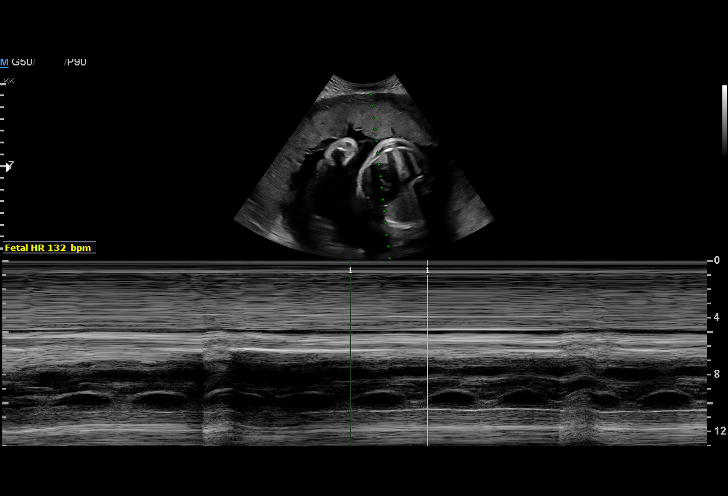
[im 6/53]
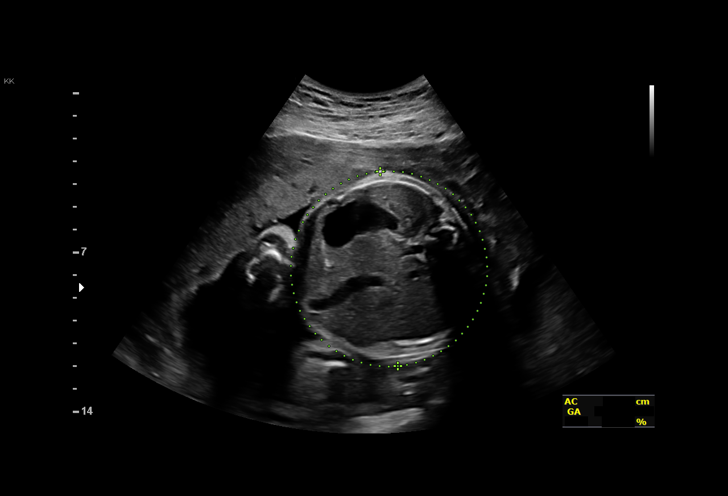
[im 10/53]
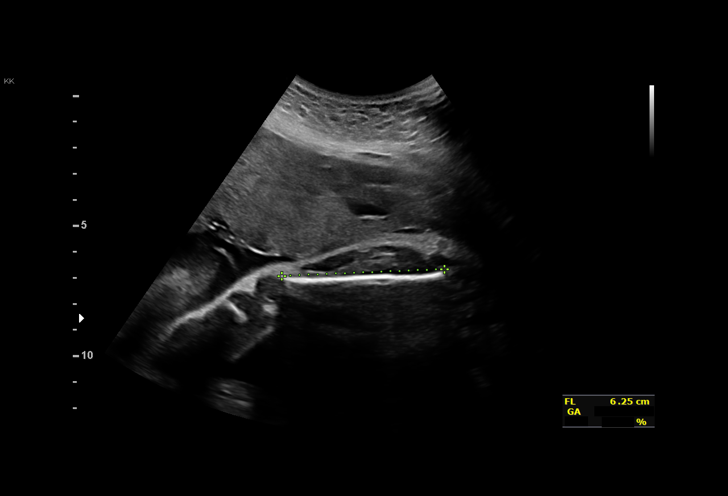
[im 14/53]
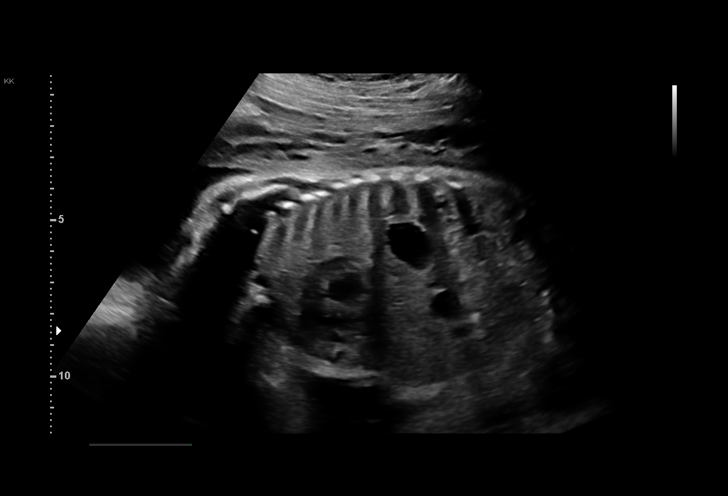
[im 18/53]
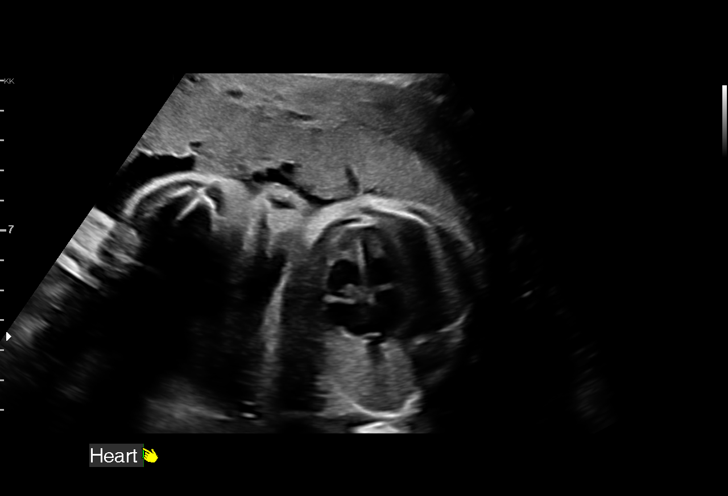
[im 22/53]
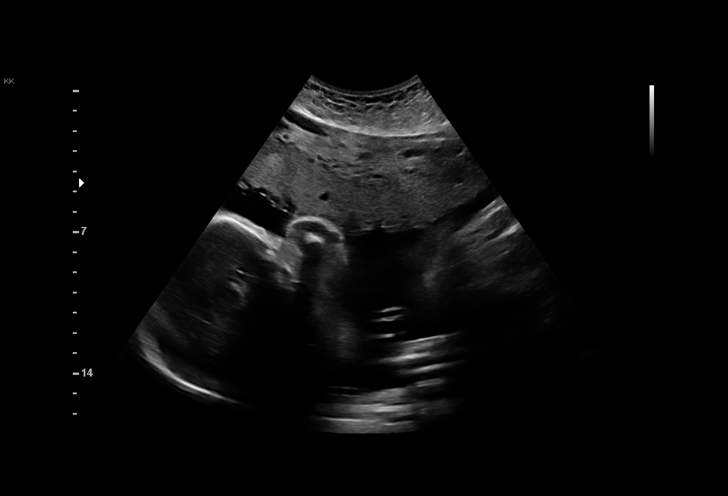
[im 26/53]
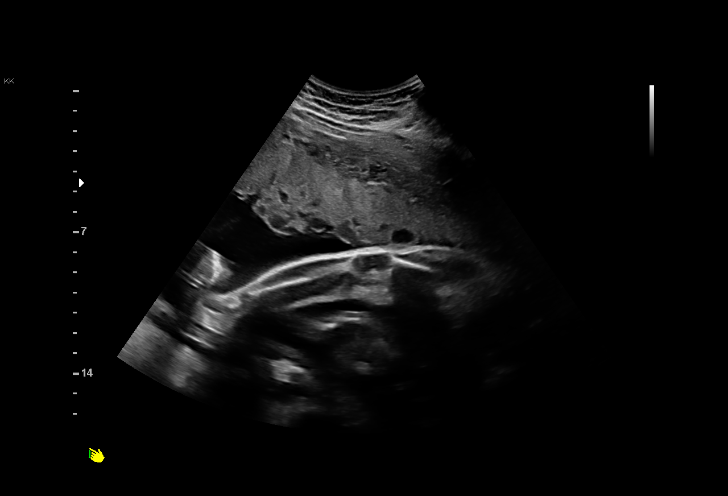
[im 29/53]
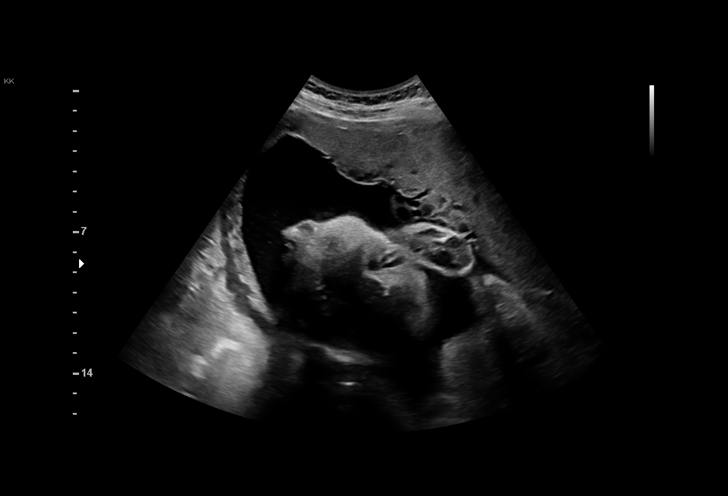
[im 33/53]
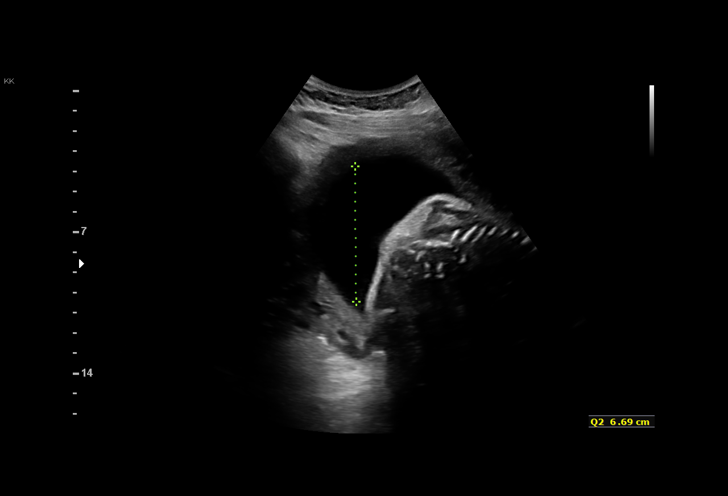
[im 37/53]
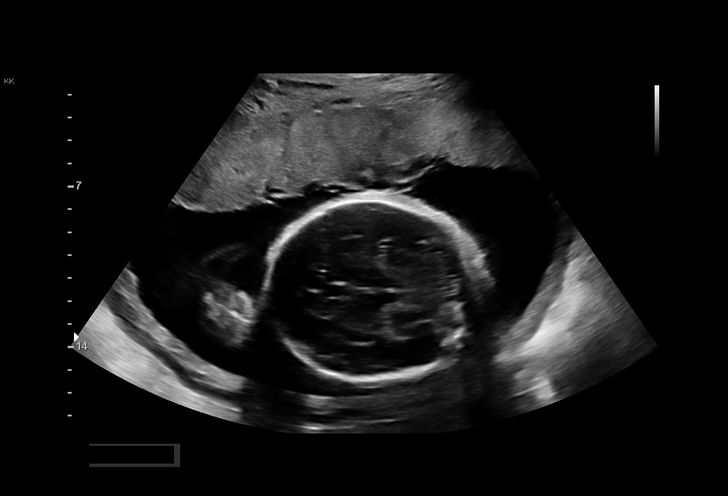
[im 41/53]
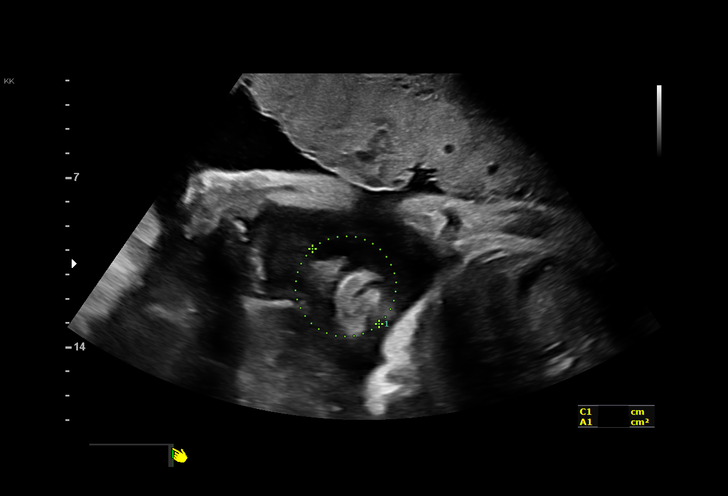
[im 45/53]
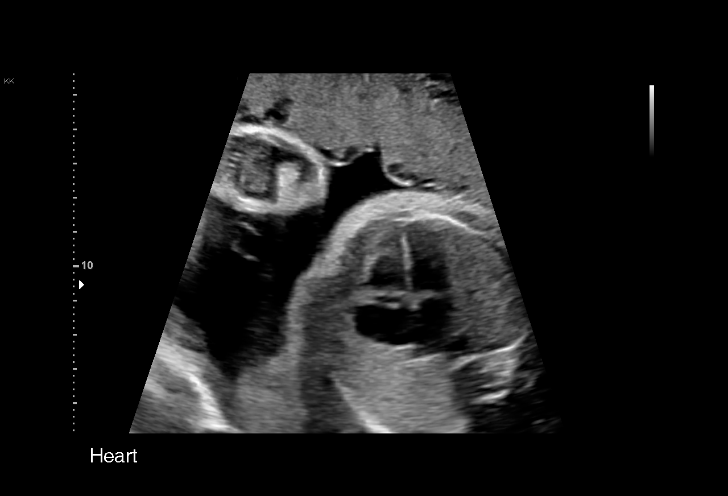
[im 49/53]
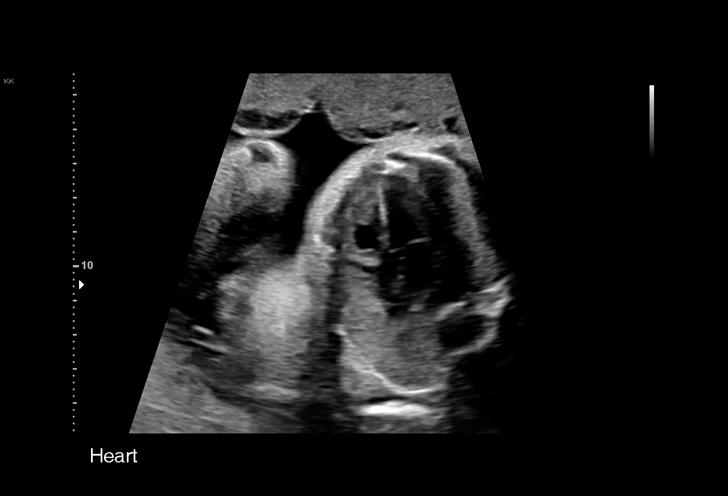
[im 53/53]
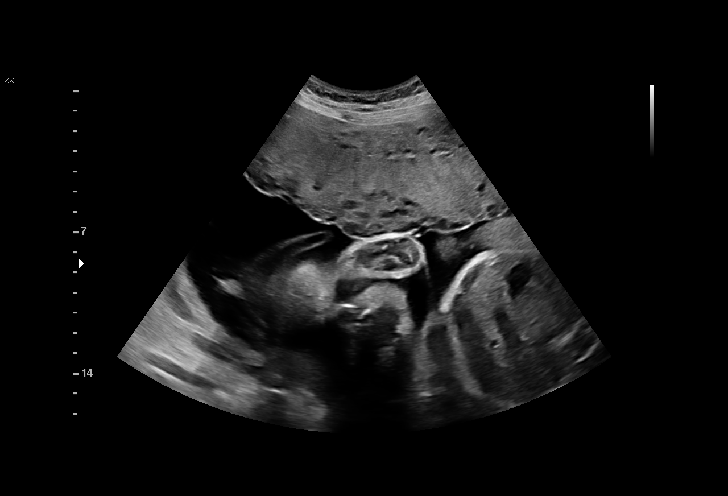

[14 of 28 positions shown; findings below may reference images not displayed]

Indications

 Poor weight gain of pregnancy, third trimester
 30 weeks gestation of pregnancy
 Encounter for other antenatal screening
 follow-up
 Negative AFP(Low Risk NIPS)
 Genetic carrier (Silent Abdelbagi Karama)
Fetal Evaluation

 Num Of Fetuses:         1
 Fetal Heart Rate(bpm):  132
 Cardiac Activity:       Observed
 Presentation:           Frank breech
 Placenta:               Anterior
 P. Cord Insertion:      Previously Visualized

 Amniotic Fluid
 AFI FV:      Within normal limits

 AFI Sum(cm)     %Tile       Largest Pocket(cm)
 17.9            67

 RUQ(cm)       RLQ(cm)       LUQ(cm)        LLQ(cm)

Biometry

 BPD:      77.8  mm     G. Age:  31w 2d         75  %    CI:        72.51   %    70 - 86
                                                         FL/HC:      21.5   %    19.2 -
 HC:      290.6  mm     G. Age:  32w 0d         73  %    HC/AC:      1.09        0.99 -
 AC:      266.3  mm     G. Age:  30w 5d         68  %    FL/BPD:     80.2   %    71 - 87
 FL:       62.4  mm     G. Age:  32w 2d         91  %    FL/AC:      23.4   %    20 - 24
 Est. FW:    2523  gm    3 lb 14 oz      84  %
OB History

 Gravidity:    2         Term:   0        Prem:   0        SAB:   1
 TOP:          0       Ectopic:  0        Living: 0
Gestational Age

 LMP:           29w 2d        Date:  04/30/19                 EDD:   02/04/20
 U/S Today:     31w 4d                                        EDD:   01/19/20
 Best:          30w 0d     Det. By:  Early Ultrasound         EDD:   01/30/20
                                     (06/26/19)
Anatomy

 Cranium:               Appears normal         LVOT:                   Previously seen
 Cavum:                 Appears normal         Aortic Arch:            Previously seen
 Ventricles:            Appears normal         Ductal Arch:            Previously seen
 Choroid Plexus:        Previously seen        Diaphragm:              Appears normal
 Cerebellum:            Previously seen        Stomach:                Appears normal, left
                                                                       sided
 Posterior Fossa:       Previously seen        Abdomen:                Appears normal
 Nuchal Fold:           Previously seen        Abdominal Wall:         Previously seen
 Face:                  Orbits and profile     Cord Vessels:           Previously seen
                        previously seen
 Lips:                  Previously seen        Kidneys:                Appear normal
 Palate:                Previously seen        Bladder:                Appears normal
 Thoracic:              Appears normal         Spine:                  Previously seen
 Heart:                 Appears normal         Upper Extremities:      Previously seen
                        (4CH, axis, and
                        situs)
 RVOT:                  Previously seen        Lower Extremities:      Previously seen

 Other:  Heels and Nasal bone previously visualized.
Cervix Uterus Adnexa

 Cervix
 Not visualized (advanced GA >76wks)
Comments

 This patient was seen for a follow up growth scan due to poor
 maternal weight gain during her pregnancy.
 The fetal growth and amniotic fluid level appears appropriate
 for her gestational age.
 As the fetal growth is within normal limits, no further exams
 were scheduled in our office.

## 2020-10-29 ENCOUNTER — Encounter: Payer: Medicaid Other | Admitting: Student

## 2020-10-29 NOTE — Progress Notes (Signed)
   CC: Vaginal itchiness, establish care  HPI:  [name] is a 30 y.o. with a history of pregnancy in 01/2020 complicated by group B strep, thrombocytopenia and anemia who is presenting to establish care and for concerns of vaginal itchiness.  Otherwise patient reports that she is doing well, she denies any other complaints including chest pain, shortness of breath, nausea, vomiting, fevers, chills, headaches, lightheadedness, dizziness, rash, or other symptoms.  Medications: Patient is not taking any medications at this time, occasionally will take over-the-counter ibuprofen or Tylenol however this is not regular. Allergies: Denies any known drug allergies Family: Denies any significant family history including hypertension, diabetes, cancer. Social: Currently a stay-at-home mother, has a 55-month-old baby who is doing well.  She previously worked as a Lawyer.  She denies any smoking or recreational drug use.  Occasional alcohol use of approximately 1 glass of alcohol per week.   Past Medical History:  Diagnosis Date  . BV (bacterial vaginosis)   . Medical history non-contributory   . Yeast vaginitis    Review of Systems:   Constitutional: Negative for chills and fever.  Respiratory: Negative for shortness of breath.   Cardiovascular: Negative for chest pain and leg swelling.  Gastrointestinal: Negative for abdominal pain, nausea and vomiting.  Genitourinary: Positive for vaginal itchiness, negative for discharge, dysuria, hematuria, dyspaurenia.  Neurological: Negative for dizziness and headaches.    Physical Exam:  Vitals:   10/31/20 1011  BP: 113/76  Pulse: 90  Temp: 99.1 F (37.3 C)  TempSrc: Oral  SpO2: 100%  Weight: 168 lb (76.2 kg)  Height: 5\' 8"  (1.727 m)   Physical Exam Exam conducted with a chaperone present.  HENT:     Head: Normocephalic and atraumatic.  Eyes:     Conjunctiva/sclera: Conjunctivae normal.     Pupils: Pupils are equal, round, and reactive to  light.  Neck:     Thyroid: No thyromegaly.  Cardiovascular:     Rate and Rhythm: Normal rate and regular rhythm.     Heart sounds: Normal heart sounds. No murmur heard. No friction rub. No gallop.   Pulmonary:     Effort: Pulmonary effort is normal. No respiratory distress.     Breath sounds: Normal breath sounds. No wheezing.  Abdominal:     General: Bowel sounds are normal. There is no distension.     Palpations: Abdomen is soft.  Genitourinary:    Exam position: Lithotomy position.     Pubic Area: No rash.      Vagina: Vaginal discharge (White discharge within canal) present. No erythema or bleeding.     Cervix: Erythema and cervical bleeding present. No cervical motion tenderness.     Uterus: Normal.     Musculoskeletal:        General: Normal range of motion.     Cervical back: Normal range of motion and neck supple.  Skin:    General: Skin is warm and dry.     Findings: No erythema.  Neurological:     Mental Status: She is alert and oriented to person, place, and time.     Gait: Gait is intact.  Psychiatric:        Mood and Affect: Mood and affect normal.      Assessment & Plan:   See Encounters Tab for problem based charting.  Patient discussed with Dr. 

## 2020-10-31 ENCOUNTER — Other Ambulatory Visit: Payer: Self-pay

## 2020-10-31 ENCOUNTER — Ambulatory Visit: Payer: Medicaid Other | Admitting: Internal Medicine

## 2020-10-31 ENCOUNTER — Encounter: Payer: Self-pay | Admitting: Internal Medicine

## 2020-10-31 ENCOUNTER — Other Ambulatory Visit (HOSPITAL_COMMUNITY)
Admission: RE | Admit: 2020-10-31 | Discharge: 2020-10-31 | Disposition: A | Discharge status: Home / Self Care | Payer: Medicaid Other | Source: Ambulatory Visit | Attending: Internal Medicine | Admitting: Internal Medicine

## 2020-10-31 VITALS — BP 113/76 | HR 90 | Temp 99.1°F | Ht 68.0 in | Wt 168.0 lb

## 2020-10-31 DIAGNOSIS — B373 Candidiasis of vulva and vagina: Secondary | ICD-10-CM

## 2020-10-31 DIAGNOSIS — Z124 Encounter for screening for malignant neoplasm of cervix: Secondary | ICD-10-CM | POA: Insufficient documentation

## 2020-10-31 DIAGNOSIS — O99013 Anemia complicating pregnancy, third trimester: Secondary | ICD-10-CM

## 2020-10-31 DIAGNOSIS — N898 Other specified noninflammatory disorders of vagina: Secondary | ICD-10-CM | POA: Insufficient documentation

## 2020-10-31 DIAGNOSIS — Z3009 Encounter for other general counseling and advice on contraception: Secondary | ICD-10-CM | POA: Diagnosis not present

## 2020-10-31 DIAGNOSIS — E663 Overweight: Secondary | ICD-10-CM | POA: Diagnosis not present

## 2020-10-31 DIAGNOSIS — B3731 Acute candidiasis of vulva and vagina: Secondary | ICD-10-CM | POA: Insufficient documentation

## 2020-10-31 NOTE — Assessment & Plan Note (Signed)
Patient is currently sexually active, uses condoms consistently.  She gave birth in August 2021 and at her follow-up with OB/GYN she was not interested in any contraceptives at that time.  She reports that she is now interested in doing the patch, she previously had been on the pill.  Patient does not smoke, has no history of DVT, and blood pressure is good today.  Patient has an OB/GYN who she follows with, she will follow-up with them for further discussion of starting the patch.

## 2020-10-31 NOTE — Assessment & Plan Note (Signed)
Patient has a history of anemia during her pregnancy, her last hemoglobin in 01/2020 was 11, MCV 84.  She is currently not on any iron supplementation or vitamins.  We will repeat CBC today.

## 2020-10-31 NOTE — Assessment & Plan Note (Addendum)
Patient reported vaginal itchiness for the past 3 weeks, she noted a rash and some redness at the lower portion of the vaginal area.  She has tried Desitin(Zinc oxide), which she states did not help.  She also has tried keeping the area clean and dry however this does not help.  She denied any dysuria, hematuria, discharge, dyspareunia, abdominal pain, or other rashes.  On exam she has a small erosion at the right lower aspect of the mons pubis with trace lichenification, also noted whitish discharge with trace amounts of bleeding noted.  Patient reports that she is currently sexually active, uses condoms consistently.  She has a history of BV. Will obtain wet prep to evaluate for vaginitis, could also be consistent with a mild candida infection however rash was very small.  Advised patient to keep area clean and dry.  -Wet prep obtained   Addendum: Positive for candida vaginitis. Attempted to contact patient with no answer. Left [employer] compliant message. Will send in prescription for Fluconazole 150 mg once and continue reaching out to her.

## 2020-10-31 NOTE — Assessment & Plan Note (Signed)
Pap smear obtained today, patient had whitish vaginal discharge in vaginal canal, and some bleeding noted from cervix,. Pap smear obtained.

## 2020-10-31 NOTE — Patient Instructions (Signed)
[name],  It was very nice meeting you today, thank you for coming in for your physical exam.  Today we performed a Pap smear and have obtained some labs.  I will contact you with the results.  In regards to the itchiness, this could be related to excessive moisture in the area, continue to keep the area dry and clean. Please follow-up with your OB/GYN to further discuss contraception options.  Continue using your current contraceptive measures. Please try to go the COVID test done.  Please return to clinic in 1 year or sooner if needed.   Thank you again for coming in.   Anne Young.D.

## 2020-11-01 LAB — CBC
Hematocrit: 39.7 % (ref 34.0–46.6)
Hemoglobin: 12.8 g/dL (ref 11.1–15.9)
MCH: 26.4 pg — ABNORMAL LOW (ref 26.6–33.0)
MCHC: 32.2 g/dL (ref 31.5–35.7)
MCV: 82 fL (ref 79–97)
Platelets: 186 10*3/uL (ref 150–450)
RBC: 4.85 x10E6/uL (ref 3.77–5.28)
RDW: 13.3 % (ref 11.7–15.4)
WBC: 4.1 10*3/uL (ref 3.4–10.8)

## 2020-11-01 LAB — BMP8+ANION GAP
Anion Gap: 17 mmol/L (ref 10.0–18.0)
BUN/Creatinine Ratio: 17 (ref 9–23)
BUN: 13 mg/dL (ref 6–20)
CO2: 20 mmol/L (ref 20–29)
Calcium: 9.1 mg/dL (ref 8.7–10.2)
Chloride: 101 mmol/L (ref 96–106)
Creatinine, Ser: 0.76 mg/dL (ref 0.57–1.00)
Glucose: 84 mg/dL (ref 65–99)
Potassium: 4.1 mmol/L (ref 3.5–5.2)
Sodium: 138 mmol/L (ref 134–144)
eGFR: 108 mL/min/{1.73_m2} (ref >59)

## 2020-11-03 LAB — CERVICOVAGINAL ANCILLARY ONLY
Bacterial Vaginitis (gardnerella): NEGATIVE
Candida Glabrata: NEGATIVE
Candida Vaginitis: POSITIVE — AB
Chlamydia: NEGATIVE
Comment: NEGATIVE
Comment: NEGATIVE
Comment: NEGATIVE
Comment: NEGATIVE
Comment: NEGATIVE
Comment: NORMAL
Neisseria Gonorrhea: NEGATIVE
Trichomonas: NEGATIVE

## 2020-11-03 LAB — CYTOLOGY - PAP
Comment: NEGATIVE
Diagnosis: NEGATIVE
High risk HPV: NEGATIVE

## 2020-11-03 MED ORDER — FLUCONAZOLE 150 MG PO TABS: 150.0000 mg | ORAL_TABLET | Freq: Every day | ORAL | 0 refills | Status: DC

## 2020-11-03 NOTE — Addendum Note (Signed)
Addended by: Claudean Severance on: 11/03/2020 04:38 PM   Modules accepted: Orders

## 2020-11-05 NOTE — Progress Notes (Signed)
Internal Medicine Clinic Attending ° °Case discussed with Dr. Krienke  At the time of the visit.  We reviewed the resident’s history and exam and pertinent patient test results.  I agree with the assessment, diagnosis, and plan of care documented in the resident’s note.  °

## 2020-11-20 ENCOUNTER — Encounter: Payer: Self-pay | Admitting: Internal Medicine

## 2020-12-30 ENCOUNTER — Encounter: Payer: Self-pay | Admitting: *Deleted

## 2023-04-25 ENCOUNTER — Other Ambulatory Visit: Payer: Self-pay | Admitting: *Deleted

## 2023-04-25 DIAGNOSIS — Z124 Encounter for screening for malignant neoplasm of cervix: Secondary | ICD-10-CM

## 2023-04-25 NOTE — Progress Notes (Signed)
  Patient: Anne Young           Date of Birth: 1990-06-30           MRN: [id number] Visit Date: 04/25/2023 PCP: Patient, No Pcp Per  Cervical Cancer Screening Do you smoke?: No Have you ever had or been told you have an allergy to latex products?: No Marital status: Married Date of last pap smear: 2-5 yrs ago Date of last menstrual period:  (2 weeks ago) Number of pregnancies: 1 Number of births: 1 Have you ever had any of the following? Hysterectomy: No Tubal ligation (tubes tied): No Abnormal bleeding: No Abnormal pap smear: No Venereal warts: No A sex partner with venereal warts: No A high risk* sex partner: Yes  Cervical Exam  Abnormal Observations: None    Exam completed by Dr. Cathie Olden  Patient's History Patient Active Problem List   Diagnosis Date Noted   Candida vaginitis 10/31/2020   Contraceptive education 10/31/2020   Cervical cancer screening 10/31/2020   Post term pregnancy at [redacted] weeks gestation 02/07/2020   Chlamydia contact, treated 01/21/2020   Anemia in pregnancy 11/14/2019   Thrombocytopenia affecting pregnancy (HCC) 11/14/2019   Alpha thalassemia silent carrier 08/07/2019   Group B streptococcal carriage complicating pregnancy 07/27/2019   Past Medical History:  Diagnosis Date   BV (bacterial vaginosis)    Medical history non-contributory    Yeast vaginitis     No family history on file.  Social History   Occupational History   Not on file  Tobacco Use   Smoking status: Never   Smokeless tobacco: Never  Vaping Use   Vaping status: Never Used  Substance and Sexual Activity   Alcohol use: Not Currently    Comment: occasionally   Drug use: Never   Sexual activity: Yes    Birth control/protection: None

## 2023-04-26 LAB — CYTOLOGY - PAP
Comment: NEGATIVE
Diagnosis: NEGATIVE
High risk HPV: NEGATIVE

## 2023-04-27 NOTE — Progress Notes (Signed)
Normal pap letter mailed to patient.

## 2023-12-22 DIAGNOSIS — Z319 Encounter for procreative management, unspecified: Secondary | ICD-10-CM | POA: Diagnosis not present

## 2023-12-22 DIAGNOSIS — Z3141 Encounter for fertility testing: Secondary | ICD-10-CM | POA: Diagnosis not present

## 2023-12-22 DIAGNOSIS — N979 Female infertility, unspecified: Secondary | ICD-10-CM | POA: Diagnosis not present

## 2023-12-22 DIAGNOSIS — R6889 Other general symptoms and signs: Secondary | ICD-10-CM | POA: Diagnosis not present

## 2023-12-22 DIAGNOSIS — Z7689 Persons encountering health services in other specified circumstances: Secondary | ICD-10-CM | POA: Diagnosis not present

## 2023-12-22 DIAGNOSIS — E663 Overweight: Secondary | ICD-10-CM | POA: Diagnosis not present

## 2023-12-22 DIAGNOSIS — Z131 Encounter for screening for diabetes mellitus: Secondary | ICD-10-CM | POA: Diagnosis not present

## 2023-12-22 DIAGNOSIS — E611 Iron deficiency: Secondary | ICD-10-CM | POA: Diagnosis not present

## 2024-03-15 ENCOUNTER — Ambulatory Visit: Admitting: Obstetrics and Gynecology

## 2024-03-15 ENCOUNTER — Other Ambulatory Visit: Payer: Self-pay

## 2024-03-15 VITALS — BP 113/65 | HR 87 | Ht 68.0 in | Wt 165.0 lb

## 2024-03-15 DIAGNOSIS — Z3201 Encounter for pregnancy test, result positive: Secondary | ICD-10-CM

## 2024-03-15 DIAGNOSIS — Z3200 Encounter for pregnancy test, result unknown: Secondary | ICD-10-CM

## 2024-03-15 LAB — POCT PREGNANCY, URINE: Preg Test, Ur: POSITIVE — AB

## 2024-03-15 NOTE — Patient Instructions (Addendum)
 Safe Medications in Pregnancy   Acne:  Benzoyl Peroxide  Salicylic Acid   Backache/Headache:  Tylenol: 2 regular strength every 4 hours OR               2 Extra strength every 6 hours   Colds/Coughs/Allergies:  Benadryl (alcohol free) 25 mg every 6 hours as needed  Breath right strips  Claritin  Cepacol throat lozenges  Chloraseptic throat spray  Cold-Eeze- up to three times per day  Cough drops, alcohol free  Flonase (by prescription only)  Guaifenesin  Mucinex  Robitussin DM (plain only, alcohol free)  Saline nasal spray/drops  Sudafed (pseudoephedrine) & Actifed * use only after [redacted] weeks gestation and if you do not have high blood pressure  Tylenol  Vicks Vaporub  Zinc lozenges  Zyrtec   Constipation:  Colace  Ducolax suppositories  Fleet enema  Glycerin suppositories  Metamucil  Milk of magnesia  Miralax  Senokot  Smooth move tea   Diarrhea:  Kaopectate  Imodium A-D   *NO pepto Bismol   Hemorrhoids:  Anusol  Anusol HC  Preparation H  Tucks   Indigestion:  Tums  Maalox  Mylanta  Zantac  Pepcid   Insomnia:  Benadryl (alcohol free) 25mg  every 6 hours as needed  Tylenol PM  Unisom, no Gelcaps   Leg Cramps:  Tums  MagGel   Nausea/Vomiting:  Bonine  Dramamine  Emetrol  Ginger extract  Sea bands  Meclizine  Nausea medication to take during pregnancy:  Unisom (doxylamine succinate 25 mg tablets) Take one tablet daily at bedtime. If symptoms are not adequately controlled, the dose can be increased to a maximum recommended dose of two tablets daily (1/2 tablet in the morning, 1/2 tablet mid-afternoon and one at bedtime).  Vitamin B6 100mg  tablets. Take one tablet twice a day (up to 200 mg per day).   Skin Rashes:  Aveeno products  Benadryl cream or 25mg  every 6 hours as needed  Calamine Lotion  1% cortisone cream   Yeast infection:  Gyne-lotrimin 7  Monistat 7    **If taking multiple medications, please check labels to avoid  duplicating the same active ingredients  **take medication as directed on the label  ** Do not exceed 4000 mg of tylenol in 24 hours  **Do not take medications that contain aspirin or ibuprofen              CenteringPregnancy is a model of prenatal care that started 30 years ago and is used in about 600 practices around the [place]. You meet with a group of 8-12 women due around the same time as you. In Centering you will have individual time with the provider and meet as a group. There's much more time for discussion and learning. You will actually have much more time with your provider in Centering than in traditional prenatal care.? You will come directly into the Centering room and will not wait in the lobby so there is no wasted time. You will have 2-hour visits every 4 weeks then every 2 weeks. You will know your [facility]ering prenatal appointments in advance. In your last month of pregnancy, you may also come in for some individual visits. Additional appointments can be scheduled if you need more care. Studies have shown that CenteringPregnancy improves birth outcomes. We have seen especially big improvements in fewer Black women delivering babies who are too small or born too early. Visit the website [facility] for more information. Let your provider or clinic staff know if  you want to sign up or email [email or web address] for more information.   CenteringPregnancy Video

## 2024-03-15 NOTE — Progress Notes (Signed)
 Possible Pregnancy  Here today for pregnancy confirmation. UPT in office today is positive. Pt reports first positive home UPT on September 5th. Reviewed dating with patient:   LMP: 01/27/24 EDD: 11/02/24 6w 6d today  OB history reviewed. Reviewed medications and allergies with patient; list of medications safe to take during pregnancy given.  Recommended pt begin prenatal vitamin and schedule prenatal care. Patient reports regular 28 day cycle. Denies any vaginal bleeding or abdominal pain. Reviewed MAU precautions with patient. Informed patient to begin prenatal care around 10 weeks, patient to get scheduled for new OB intake and first prenatal care at checkout.  Nidia, NP welcomed patient to the clinic and all questions addressed.    Rosaline Pendleton, RN 03/15/2024  1:22 PM

## 2024-03-18 ENCOUNTER — Encounter: Payer: Self-pay | Admitting: Obstetrics and Gynecology

## 2024-03-18 NOTE — Progress Notes (Signed)
  History:  [name] is a 33 y.o. G3P1011 who presents to clinic today for positive pregnancy test. She reports LMP 01/27/24.   Past Medical History:  Diagnosis Date   BV (bacterial vaginosis)    Medical history non-contributory    Yeast vaginitis     Past Surgical History:  Procedure Laterality Date   NO PAST SURGERIES      The following portions of the patient's history were reviewed and updated as appropriate: allergies, current medications, past family history, past medical history, past social history, past surgical history and problem list.   Review of Systems:  Pertinent items noted in HPI and remainder of comprehensive ROS otherwise negative.  Objective:  Physical Exam BP 113/65   Pulse 87   Ht 5' 8 (1.727 m)   Wt 165 lb (74.8 kg)   LMP 01/27/2024 (Exact Date)   BMI 25.09 kg/m  Physical Exam Vitals and nursing note reviewed.  Constitutional:      Appearance: Normal appearance.  Pulmonary:     Effort: Pulmonary effort is normal.  Neurological:     General: No focal deficit present.     Mental Status: She is alert and oriented to person, place, and time.  Psychiatric:        Mood and Affect: Mood normal.        Behavior: Behavior normal.      Labs and Imaging No results found for this or any previous visit (from the past 24 hours).  No results found.   Assessment & Plan:  1. Possible pregnancy (Primary) Congratulated! Discussed NOB intake and visit She was provided hospital precautions  - Pregnancy, urine POC    Approximately 15 minutes of total time was spent with this patient on history taking, coordination of care, education and documentation.   Anne Nidia CROME, FNP

## 2024-04-05 ENCOUNTER — Telehealth

## 2024-04-05 DIAGNOSIS — Z3490 Encounter for supervision of normal pregnancy, unspecified, unspecified trimester: Secondary | ICD-10-CM | POA: Insufficient documentation

## 2024-04-05 DIAGNOSIS — Z3A09 9 weeks gestation of pregnancy: Secondary | ICD-10-CM | POA: Diagnosis not present

## 2024-04-05 DIAGNOSIS — Z3201 Encounter for pregnancy test, result positive: Secondary | ICD-10-CM

## 2024-04-05 DIAGNOSIS — Z3491 Encounter for supervision of normal pregnancy, unspecified, first trimester: Secondary | ICD-10-CM

## 2024-04-05 NOTE — Patient Instructions (Signed)

## 2024-04-05 NOTE — Progress Notes (Signed)
 New OB Intake  I connected with Anne Young  on 04/05/24 at  1:15 PM EDT by MyChart Video Visit and verified that I am speaking with the correct person using two identifiers. Nurse is located at [facility] and pt is located at Home.  I discussed the limitations, risks, security and privacy concerns of performing an evaluation and management service by telephone and the availability of in person appointments. I also discussed with the patient that there may be a patient responsible charge related to this service. The patient expressed understanding and agreed to proceed.  I explained I am completing New OB Intake today. We discussed EDD of 11/02/24 based on LMP of 01/27/24. Pt is G3P1011. I reviewed her allergies, medications and Medical/Surgical/OB history.    Patient Active Problem List   Diagnosis Date Noted   Alpha thalassemia silent carrier 08/07/2019     Concerns addressed today Pt did not have any concerns.  Delivery Plans Plans to deliver at [facility]. Discussed the nature of our practice with multiple providers including residents and students as well as female and female providers. Due to the size of the practice, the delivering provider may not be the same as those providing prenatal care.   Patient is not interested in water birth.  MyChart/Babyscripts MyChart access verified. I explained pt will have some visits in office and some virtually. Babyscripts instructions given and order placed. e.   Blood Pressure Cuff/Weight Scale  Explained after first prenatal appt pt will check weekly and document in Babyscripts. Patient does have weight scale.  Anatomy US  Explained first scheduled US  will be around 19 weeks. Anatomy US  scheduled for 06/11/24  at 2:00 pm.  Is patient a CenteringPregnancy candidate?  Declined Declined due to Schedule     Is patient a Mom+Baby Combined Care candidate?  Not a candidate     Is patient a candidate for Babyscripts Optimization? Yes, patient  declined   First visit review I reviewed new OB appt with patient. Explained pt will be seen by Dr. Cresenzo at first visit 04/11/24 at 2:15pm. Discussed Natera genetic screening with patient. Desires Panorama. Routine prenatal labs and genetic testing needed at [facility] OB visit.   Last Pap Diagnosis  Date Value Ref Range Status  04/25/2023   Final   - Negative for intraepithelial lesion or malignancy (NILM)    Waddell LITTIE Burows, RN 04/05/2024  1:12 PM

## 2024-04-11 ENCOUNTER — Encounter: Payer: Self-pay | Admitting: Family Medicine

## 2024-04-11 ENCOUNTER — Other Ambulatory Visit (HOSPITAL_COMMUNITY)
Admission: RE | Admit: 2024-04-11 | Discharge: 2024-04-11 | Disposition: A | Discharge status: Home / Self Care | Source: Ambulatory Visit | Attending: Family Medicine | Admitting: Family Medicine

## 2024-04-11 ENCOUNTER — Other Ambulatory Visit: Payer: Self-pay

## 2024-04-11 ENCOUNTER — Ambulatory Visit (INDEPENDENT_AMBULATORY_CARE_PROVIDER_SITE_OTHER): Payer: Self-pay | Admitting: Family Medicine

## 2024-04-11 VITALS — BP 107/70 | HR 86 | Wt 160.9 lb

## 2024-04-11 DIAGNOSIS — D509 Iron deficiency anemia, unspecified: Secondary | ICD-10-CM

## 2024-04-11 DIAGNOSIS — Z3490 Encounter for supervision of normal pregnancy, unspecified, unspecified trimester: Secondary | ICD-10-CM

## 2024-04-11 DIAGNOSIS — Z3491 Encounter for supervision of normal pregnancy, unspecified, first trimester: Secondary | ICD-10-CM | POA: Diagnosis not present

## 2024-04-11 DIAGNOSIS — D563 Thalassemia minor: Secondary | ICD-10-CM | POA: Diagnosis not present

## 2024-04-11 DIAGNOSIS — Z3A10 10 weeks gestation of pregnancy: Secondary | ICD-10-CM | POA: Diagnosis not present

## 2024-04-12 LAB — CBC/D/PLT+RPR+RH+ABO+RUBIGG...
Antibody Screen: NEGATIVE
Basophils Absolute: 0 x10E3/uL (ref 0.0–0.2)
Basos: 0 %
EOS (ABSOLUTE): 0.2 x10E3/uL (ref 0.0–0.4)
Eos: 5 %
HCV Ab: NONREACTIVE
HIV Screen 4th Generation wRfx: NONREACTIVE
Hematocrit: 35.4 % (ref 34.0–46.6)
Hemoglobin: 11.3 g/dL (ref 11.1–15.9)
Hepatitis B Surface Ag: NEGATIVE
Immature Grans (Abs): 0 x10E3/uL (ref 0.0–0.1)
Immature Granulocytes: 0 %
Lymphocytes Absolute: 1.4 x10E3/uL (ref 0.7–3.1)
Lymphs: 30 %
MCH: 26.4 pg — ABNORMAL LOW (ref 26.6–33.0)
MCHC: 31.9 g/dL (ref 31.5–35.7)
MCV: 83 fL (ref 79–97)
Monocytes Absolute: 0.3 x10E3/uL (ref 0.1–0.9)
Monocytes: 7 %
Neutrophils Absolute: 2.7 x10E3/uL (ref 1.4–7.0)
Neutrophils: 58 %
Platelets: 189 x10E3/uL (ref 150–450)
RBC: 4.28 x10E6/uL (ref 3.77–5.28)
RDW: 13.8 % (ref 11.7–15.4)
RPR Ser Ql: NONREACTIVE
Rh Factor: POSITIVE
Rubella Antibodies, IGG: 14 {index} (ref >0.99)
WBC: 4.6 x10E3/uL (ref 3.4–10.8)

## 2024-04-12 LAB — HCV INTERPRETATION

## 2024-04-12 LAB — CERVICOVAGINAL ANCILLARY ONLY
Chlamydia: NEGATIVE
Comment: NEGATIVE
Comment: NORMAL
Neisseria Gonorrhea: NEGATIVE

## 2024-04-12 LAB — HEMOGLOBIN A1C
Est. average glucose Bld gHb Est-mCnc: 105 mg/dL
Hgb A1c MFr Bld: 5.3 % (ref 4.8–5.6)

## 2024-04-12 NOTE — Progress Notes (Signed)
 Subjective:   Anne Young is a 33 y.o. G3P1011 at [redacted]w[redacted]d by LMP being seen today for her first obstetrical visit.  Her obstetrical history is significant for prior vaginal delivery without complications. Patient does intend to breast feed. Pregnancy history fully reviewed.  Patient reports no bleeding, no contractions, no cramping, and no leaking.  HISTORY: OB History  Gravida Para Term Preterm AB Living  3 1 1  0 1 1  SAB IAB Ectopic Multiple Live Births  0 0 0 0 1    # Outcome Date GA Lbr Len/2nd Weight Sex Type Anes PTL Lv  3 Current           2 Term 02/07/20 [redacted]w[redacted]d / 00:19 8 lb 5.3 oz (3.779 kg) F Vag-Spont None  LIV     Birth Comments: WDL     Name: Scoggins,GIRL Delanee     Apgar1: 8  Apgar5: 9  1 AB 2018           Last pap smear was 04/25/2023 and was normal Past Medical History:  Diagnosis Date   BV (bacterial vaginosis)    Medical history non-contributory    Thrombocytopenia affecting pregnancy 11/14/2019   Platelets 141 on 11/13/2019, recheck at 36 weeks     Yeast vaginitis    Past Surgical History:  Procedure Laterality Date   NO PAST SURGERIES     Family History  Problem Relation Age of Onset   Healthy Mother    Healthy Father    Social History   Tobacco Use   Smoking status: Never   Smokeless tobacco: Never  Vaping Use   Vaping status: Never Used  Substance Use Topics   Alcohol use: Not Currently    Comment: occasionally   Drug use: Never   No Known Allergies Current Outpatient Medications on File Prior to Visit  Medication Sig Dispense Refill   acetaminophen  (TYLENOL ) 325 MG tablet Take 2 tablets (650 mg total) by mouth every 6 (six) hours. (Patient not taking: Reported on 04/11/2024)     doxylamine, Sleep, (UNISOM) 25 MG tablet Take 25 mg by mouth at bedtime as needed. (Patient not taking: Reported on 04/11/2024)     fluconazole  (DIFLUCAN ) 150 MG tablet Take 1 tablet (150 mg total) by mouth daily. (Patient not taking: Reported on 04/11/2024) 1 tablet  0   Prenatal Vit-Fe Fumarate-FA (MULTIVITAMIN-PRENATAL) 27-0.8 MG TABS tablet Take 1 tablet by mouth daily at 12 noon. (Patient not taking: Reported on 04/11/2024)     pyridOXINE (VITAMIN B6) 25 MG tablet Take 25 mg by mouth daily. (Patient not taking: Reported on 04/11/2024)     No current facility-administered medications on file prior to visit.     Exam   Vitals:   04/11/24 1435  BP: 107/70  Pulse: 86  Weight: 160 lb 14.4 oz (73 kg)   Fetal Heart Rate (bpm): 164  System: General: well-developed, well-nourished female in no acute distress   Skin: normal coloration and turgor, no rashes   Neurologic: oriented, normal, negative, normal mood   Extremities: normal strength, tone, and muscle mass, ROM of all joints is normal   HEENT PERRLA, extraocular movement intact and sclera clear, anicteric   Mouth/Teeth mucous membranes moist, pharynx normal without lesions and dental hygiene good   Neck supple and no masses   Cardiovascular: regular rate and rhythm   Respiratory:  no respiratory distress, normal breath sounds   Abdomen: soft, non-tender; bowel sounds normal; no masses,  no organomegaly  Assessment:   Pregnancy: G3P1011 Patient Active Problem List   Diagnosis Date Noted   Iron deficiency anemia 04/11/2024   Encounter for supervision of low-risk pregnancy, antepartum 04/05/2024   Alpha thalassemia silent carrier 08/07/2019     Plan:  1. Encounter for supervision of low-risk pregnancy, antepartum BP appropriate today - [facility] AMB BABYSCRIPTS OPT IN  2. Alpha thalassemia silent carrier Patient found this out during previous pregnancy on [place] screening  3. Iron deficiency anemia, unspecified iron deficiency anemia type Previous history of iron deficiency Will check CBC today  4. [redacted] weeks gestation of pregnancy (Primary) - CBC/D/Plt+RPR+Rh+ABO+RubIgG... - PANORAMA PRENATAL TEST - Culture, OB Urine - Cervicovaginal ancillary only( [place]) - Hemoglobin  A1c   Initial labs drawn. Continue prenatal vitamins. Genetic Screening discussed, Quad screen: ordered. Ultrasound discussed; fetal anatomic survey: ordered. Problem list reviewed and updated. The nature of [facility] with multiple MDs and other Advanced Practice Providers was explained to patient; also emphasized that residents, students are part of our team. Routine obstetric precautions reviewed. No follow-ups on file.

## 2024-04-13 ENCOUNTER — Telehealth: Payer: Self-pay

## 2024-04-13 NOTE — Telephone Encounter (Signed)
 RN called pt to ask if she would mind if her US  appointment was changed to Tuesday 04/17/24 at 02:35pm.  She verbalized she is agreeable to this change.  Appointment rescheduled in MyChart.    Waddell, RN

## 2024-04-16 ENCOUNTER — Other Ambulatory Visit

## 2024-04-17 ENCOUNTER — Other Ambulatory Visit: Payer: Self-pay

## 2024-04-17 ENCOUNTER — Other Ambulatory Visit: Payer: Self-pay | Admitting: Family Medicine

## 2024-04-17 ENCOUNTER — Other Ambulatory Visit

## 2024-04-17 DIAGNOSIS — Z3A11 11 weeks gestation of pregnancy: Secondary | ICD-10-CM | POA: Diagnosis not present

## 2024-04-17 DIAGNOSIS — Z3201 Encounter for pregnancy test, result positive: Secondary | ICD-10-CM

## 2024-04-17 DIAGNOSIS — Z3491 Encounter for supervision of normal pregnancy, unspecified, first trimester: Secondary | ICD-10-CM

## 2024-04-17 DIAGNOSIS — Z3490 Encounter for supervision of normal pregnancy, unspecified, unspecified trimester: Secondary | ICD-10-CM

## 2024-04-17 LAB — PANORAMA PRENATAL TEST FULL PANEL:PANORAMA TEST PLUS 5 ADDITIONAL MICRODELETIONS: FETAL FRACTION: 14

## 2024-05-07 NOTE — Progress Notes (Unsigned)
 PRENATAL VISIT NOTE  Subjective:  Anne Young is a 33 y.o. G3P1011 at [redacted]w[redacted]d being seen today for ongoing prenatal care.  She is currently monitored for the following issues for this {Blank single:19197::high-risk,low-risk} pregnancy and has Alpha thalassemia silent carrier; Encounter for supervision of low-risk pregnancy, antepartum; and Iron deficiency anemia on their problem list.  Patient reports {sx:14538}.   .  .   . Denies leaking of fluid.   The following portions of the patient's history were reviewed and updated as appropriate: allergies, current medications, past family history, past medical history, past social history, past surgical history and problem list.   Objective:   There were no vitals filed for this visit.  Fetal Status:           General: Alert, oriented and cooperative. Patient is in no acute distress.  Skin: Skin is warm and dry. No rash noted.   Cardiovascular: Normal heart rate noted  Respiratory: Normal respiratory effort, no problems with respiration noted  Abdomen: Soft, gravid, appropriate for gestational age.        Pelvic: {Blank single:19197::Cervical exam performed in the presence of a chaperone,Cervical exam deferred}        Extremities: Normal range of motion.     Mental Status: Normal mood and affect. Normal behavior. Normal judgment and thought content.      04/11/2024    4:53 PM 10/31/2020   10:58 AM 02/04/2020   10:24 AM  Depression screen PHQ 2/9  Decreased Interest 3 0 0  Down, Depressed, Hopeless 0 0 0  PHQ - 2 Score 3 0 0  Altered sleeping 3 0 0  Tired, decreased energy 3 0 2  Change in appetite 0 0 0  Feeling bad or failure about yourself  0 0 0  Trouble concentrating 0 0 0  Moving slowly or fidgety/restless 0 0 0  Suicidal thoughts 0 0 0  PHQ-9 Score 9  0  2   Difficult doing work/chores  Not difficult at all      Data saved with a previous flowsheet row definition        04/11/2024    4:53 PM 02/04/2020   10:23 AM  01/28/2020   11:28 AM 01/21/2020    1:42 PM  GAD 7 : Generalized Anxiety Score  Nervous, Anxious, on Edge 0 2 3 2   Control/stop worrying 0 0 2 0  Worry too much - different things 0 2 3 2   Trouble relaxing 0 0 0 0  Restless 0 0 0 0  Easily annoyed or irritable 0 0 0 0  Afraid - awful might happen 0 2 0 0  Total GAD 7 Score 0 6 8 4     Assessment and Plan:  Pregnancy: G3P1011 at [redacted]w[redacted]d  1. Encounter for supervision of low-risk pregnancy, antepartum (Primary) Patient doing well, feeling regular fetal movement BP, FHR, FH appropriate   2. [redacted] weeks gestation of pregnancy Anticipatory guidance about next visits/weeks of pregnancy given.   3. Alpha thalassemia silent carrier Partner testing?   4. Iron deficiency anemia, unspecified iron deficiency anemia type 04/11/24 hgb 11.3  Preterm labor symptoms and general obstetric precautions including but not limited to vaginal bleeding, contractions, leaking of fluid and fetal movement were reviewed in detail with the patient. Please refer to After Visit Summary for other counseling recommendations.   No follow-ups on file.  Future Appointments  Date Time Provider Department Center  05/08/2024  1:15 PM Jayleah Garbers E, PA-C [facility]  06/11/2024  2:00 PM [facility] PROVIDER 1 [facility]  06/11/2024  2:30 PM [facility] US2 [facility]    Jorene FORBES Moats, PA-C

## 2024-05-08 ENCOUNTER — Ambulatory Visit: Admitting: Physician Assistant

## 2024-05-08 ENCOUNTER — Other Ambulatory Visit: Payer: Self-pay

## 2024-05-08 VITALS — BP 105/71 | HR 90 | Wt 160.1 lb

## 2024-05-08 DIAGNOSIS — L304 Erythema intertrigo: Secondary | ICD-10-CM

## 2024-05-08 DIAGNOSIS — D509 Iron deficiency anemia, unspecified: Secondary | ICD-10-CM | POA: Diagnosis not present

## 2024-05-08 DIAGNOSIS — Z3492 Encounter for supervision of normal pregnancy, unspecified, second trimester: Secondary | ICD-10-CM | POA: Diagnosis not present

## 2024-05-08 DIAGNOSIS — Z3A14 14 weeks gestation of pregnancy: Secondary | ICD-10-CM | POA: Diagnosis not present

## 2024-05-08 DIAGNOSIS — Z3490 Encounter for supervision of normal pregnancy, unspecified, unspecified trimester: Secondary | ICD-10-CM

## 2024-05-08 DIAGNOSIS — D563 Thalassemia minor: Secondary | ICD-10-CM

## 2024-05-08 MED ORDER — NYSTATIN 100000 UNIT/GM EX POWD: CUTANEOUS | 0 refills | Status: AC

## 2024-05-08 MED ORDER — KETOCONAZOLE 2 % EX CREA: TOPICAL_CREAM | CUTANEOUS | 0 refills | Status: AC

## 2024-05-08 NOTE — Patient Instructions (Signed)
 Patient education:  Cottie is a common skin condition that occurs in areas where skin rubs against skin, such as under the breasts, in the groin, and between the toes. This friction, combined with moisture and lack of air circulation, leads to inflammation and irritation of the skin.   Causes and symptoms:  Intertrigo is primarily caused by skin-on-skin friction in warm, moist areas of the body. Factors that can contribute to intertrigo include obesity, excessive sweating, and wearing tight or non-breathable clothing. Symptoms of intertrigo include redness, itching, burning, and sometimes a foul odor. The affected skin may also become cracked or crusty.   Preventing intertrigo involves keeping the skin dry and reducing friction. Here are some tips:  -Wear loose, breathable clothing made of natural fibers like cotton -Apply absorbent powders, such as cornstarch, to keep the skin dry -Maintain good hygiene by showering daily with mild soap and thoroughly drying skin folds- you may dry with a hair dryer on a cool setting -Weight loss in patients who are overweight or obese -Shower and dry off immediately after activities that cause sweating

## 2024-05-26 ENCOUNTER — Other Ambulatory Visit: Payer: Self-pay

## 2024-05-26 ENCOUNTER — Inpatient Hospital Stay (HOSPITAL_COMMUNITY)
Admission: AD | Admit: 2024-05-26 | Discharge: 2024-05-26 | Disposition: A | Discharge status: Home / Self Care | Attending: Obstetrics and Gynecology | Admitting: Obstetrics and Gynecology

## 2024-05-26 ENCOUNTER — Inpatient Hospital Stay (HOSPITAL_COMMUNITY)

## 2024-05-26 DIAGNOSIS — R1011 Right upper quadrant pain: Secondary | ICD-10-CM | POA: Diagnosis present

## 2024-05-26 DIAGNOSIS — O99612 Diseases of the digestive system complicating pregnancy, second trimester: Secondary | ICD-10-CM | POA: Diagnosis not present

## 2024-05-26 DIAGNOSIS — K828 Other specified diseases of gallbladder: Secondary | ICD-10-CM | POA: Insufficient documentation

## 2024-05-26 DIAGNOSIS — Z3A17 17 weeks gestation of pregnancy: Secondary | ICD-10-CM | POA: Diagnosis not present

## 2024-05-26 LAB — CBC WITH DIFFERENTIAL/PLATELET
Abs Immature Granulocytes: 0.02 K/uL (ref 0.00–0.07)
Basophils Absolute: 0 K/uL (ref 0.0–0.1)
Basophils Relative: 0 %
Eosinophils Absolute: 0.3 K/uL (ref 0.0–0.5)
Eosinophils Relative: 4 %
HCT: 30.8 % — ABNORMAL LOW (ref 36.0–46.0)
Hemoglobin: 10.3 g/dL — ABNORMAL LOW (ref 12.0–15.0)
Immature Granulocytes: 0 %
Lymphocytes Relative: 22 %
Lymphs Abs: 1.5 K/uL (ref 0.7–4.0)
MCH: 26.6 pg (ref 26.0–34.0)
MCHC: 33.4 g/dL (ref 30.0–36.0)
MCV: 79.6 fL — ABNORMAL LOW (ref 80.0–100.0)
Monocytes Absolute: 0.5 K/uL (ref 0.1–1.0)
Monocytes Relative: 7 %
Neutro Abs: 4.5 K/uL (ref 1.7–7.7)
Neutrophils Relative %: 67 %
Platelets: 146 K/uL — ABNORMAL LOW (ref 150–400)
RBC: 3.87 MIL/uL (ref 3.87–5.11)
RDW: 13.1 % (ref 11.5–15.5)
WBC: 6.7 K/uL (ref 4.0–10.5)
nRBC: 0 % (ref 0.0–0.2)

## 2024-05-26 LAB — COMPREHENSIVE METABOLIC PANEL WITH GFR
ALT: 9 U/L (ref 0–44)
AST: 12 U/L — ABNORMAL LOW (ref 15–41)
Albumin: 3.1 g/dL — ABNORMAL LOW (ref 3.5–5.0)
Alkaline Phosphatase: 27 U/L — ABNORMAL LOW (ref 38–126)
Anion gap: 9 (ref 5–15)
BUN: <5 mg/dL — ABNORMAL LOW (ref 6–20)
CO2: 22 mmol/L (ref 22–32)
Calcium: 8.7 mg/dL — ABNORMAL LOW (ref 8.9–10.3)
Chloride: 105 mmol/L (ref 98–111)
Creatinine, Ser: 0.58 mg/dL (ref 0.44–1.00)
GFR, Estimated: >60 mL/min (ref >60)
Glucose, Bld: 84 mg/dL (ref 70–99)
Potassium: 4.2 mmol/L (ref 3.5–5.1)
Sodium: 136 mmol/L (ref 135–145)
Total Bilirubin: 0.4 mg/dL (ref 0.0–1.2)
Total Protein: 6 g/dL — ABNORMAL LOW (ref 6.5–8.1)

## 2024-05-26 LAB — URINALYSIS, ROUTINE W REFLEX MICROSCOPIC
Bilirubin Urine: NEGATIVE
Glucose, UA: NEGATIVE mg/dL
Hgb urine dipstick: NEGATIVE
Ketones, ur: NEGATIVE mg/dL
Leukocytes,Ua: NEGATIVE
Nitrite: NEGATIVE
Protein, ur: 30 mg/dL — AB
Specific Gravity, Urine: 1.02 (ref 1.005–1.030)
pH: 8.5 — ABNORMAL HIGH (ref 5.0–8.0)

## 2024-05-26 LAB — URINALYSIS, MICROSCOPIC (REFLEX)

## 2024-05-26 LAB — WET PREP, GENITAL
Clue Cells Wet Prep HPF POC: NONE SEEN
Sperm: NONE SEEN
Trich, Wet Prep: NONE SEEN
WBC, Wet Prep HPF POC: >=10 — AB (ref <10)
Yeast Wet Prep HPF POC: NONE SEEN

## 2024-05-26 LAB — LIPASE, BLOOD: Lipase: 21 U/L (ref 11–51)

## 2024-05-26 LAB — AMYLASE: Amylase: 104 U/L — ABNORMAL HIGH (ref 28–100)

## 2024-05-26 MED ORDER — ACETAMINOPHEN 500 MG PO TABS
1000.0000 mg | ORAL_TABLET | Freq: Once | ORAL | Status: AC
Start: 2024-05-26 — End: 2024-05-26
  Administered 2024-05-26: 1000 mg via ORAL
  Filled 2024-05-26: qty 2

## 2024-05-26 NOTE — MAU Note (Signed)
.  Anne Young is a 33 y.o. at [redacted]w[redacted]d here in MAU reporting:  Onset of complaint: severe pain on right side  Pain score: 8/10 There were no vitals filed for this visit.    Lab orders placed from triage:   ua

## 2024-05-26 NOTE — Discharge Instructions (Signed)

## 2024-05-26 NOTE — MAU Provider Note (Addendum)
 S Anne Young is a 33 y.o. G15P1011 pregnant female with a low risk pregnancy and PMH of alph thalassemia silent carrier, iron deficiency anemia, and intertrigo at [redacted]w[redacted]d (by U/S & LMP) who presents to MAU today with complaint of right upper quadrant pain and vomiting.  She reports that the pain started around midnight and it is constant pain that is a sharp poking feeling that hurts especially when I inhale.  She hasn't taken any meds for it and states when she stands or lays on her left side it is a little better.  Pain does not radiate anywhere but she states she feels the pain in her back on the right side.  She reports vomiting last night that was kind of green but she ate something with green leaves in it so unclear if it was bilious vomiting. She did mention the food was quite spicy but not unusual for her. She did not eat anything out of the ordinary for her and states there was no blood in her vomit.  She thought after throwing up the pain would go away and was able to sleep from 4:30-8:30 am but pain did not go away. Denies fever, SOB, chest pain, diarrhea, leaking fluid, headache, blurry vision, seeing spots, or peripheral edema.  Receives care at [facility] for [facility]. Prenatal records reviewed.  Review of Systems  Constitutional:  Negative for fever.  Eyes:  Negative for blurred vision.  Respiratory:  Negative for shortness of breath.   Cardiovascular:  Negative for chest pain.  Gastrointestinal:  Positive for abdominal pain, nausea and vomiting. Negative for diarrhea.  Musculoskeletal:  Positive for back pain.  Skin:  Negative for rash.  Neurological:  Negative for headaches.      Pertinent items noted in HPI and remainder of comprehensive ROS otherwise negative.    O BP 102/62 (BP Location: Right Arm)   Pulse 75   Temp 98.3 F (36.8 C) (Oral)   Resp 16   Wt 72.1 kg   LMP 01/27/2024 (Exact Date)   SpO2 100%   BMI 24.18 kg/m  Physical Exam Constitutional:       General: She is not in acute distress.    Appearance: She is well-developed and normal weight.  Eyes:     Extraocular Movements: Extraocular movements intact.  Abdominal:     Tenderness: There is abdominal tenderness in the right upper quadrant. There is no right CVA tenderness or guarding.  Genitourinary:    General: Normal vulva.     Cervix: Normal.     Comments: Cervical Exam done with chaperone present.  Patient tolerated exam normally. Cervical os visible and closed. No abnormal discharge or pooling of fluid seen on exam Musculoskeletal:     Comments: Back pain posterior to RUQ pain on palpation.  Neurological:     Mental Status: She is alert.  Psychiatric:        Mood and Affect: Mood normal.        Behavior: Behavior normal.      FHR: 150 bpm   Results for orders placed or performed during the hospital encounter of 05/26/24 (from the past 24 hours)  Urinalysis, Routine w reflex microscopic -Urine, Clean Catch     Status: Abnormal   Collection Time: 05/26/24 11:08 AM  Result Value Ref Range   Color, Urine YELLOW YELLOW   APPearance CLEAR CLEAR   Specific Gravity, Urine 1.020 1.005 - 1.030   pH 8.5 (H) 5.0 - 8.0   Glucose, UA  NEGATIVE NEGATIVE mg/dL   Hgb urine dipstick NEGATIVE NEGATIVE   Bilirubin Urine NEGATIVE NEGATIVE   Ketones, ur NEGATIVE NEGATIVE mg/dL   Protein, ur 30 (A) NEGATIVE mg/dL   Nitrite NEGATIVE NEGATIVE   Leukocytes,Ua NEGATIVE NEGATIVE  Urinalysis, Microscopic (reflex)     Status: Abnormal   Collection Time: 05/26/24 11:08 AM  Result Value Ref Range   RBC / HPF 0-5 0 - 5 RBC/hpf   WBC, UA 0-5 0 - 5 WBC/hpf   Bacteria, UA RARE (A) NONE SEEN   Squamous Epithelial / HPF 0-5 0 - 5 /HPF   Mucus PRESENT   Wet prep, genital     Status: Abnormal   Collection Time: 05/26/24 12:37 PM  Result Value Ref Range   Yeast Wet Prep HPF POC NONE SEEN NONE SEEN   Trich, Wet Prep NONE SEEN NONE SEEN   Clue Cells Wet Prep HPF POC NONE SEEN NONE SEEN   WBC, Wet  Prep HPF POC >=10 (A) <10   Sperm NONE SEEN   CBC with Differential/Platelet     Status: Abnormal   Collection Time: 05/26/24 12:58 PM  Result Value Ref Range   WBC 6.7 4.0 - 10.5 K/uL   RBC 3.87 3.87 - 5.11 MIL/uL   Hemoglobin 10.3 (L) 12.0 - 15.0 g/dL   HCT 69.1 (L) 63.9 - 53.9 %   MCV 79.6 (L) 80.0 - 100.0 fL   MCH 26.6 26.0 - 34.0 pg   MCHC 33.4 30.0 - 36.0 g/dL   RDW 86.8 88.4 - 84.4 %   Platelets 146 (L) 150 - 400 K/uL   nRBC 0.0 0.0 - 0.2 %   Neutrophils Relative % 67 %   Neutro Abs 4.5 1.7 - 7.7 K/uL   Lymphocytes Relative 22 %   Lymphs Abs 1.5 0.7 - 4.0 K/uL   Monocytes Relative 7 %   Monocytes Absolute 0.5 0.1 - 1.0 K/uL   Eosinophils Relative 4 %   Eosinophils Absolute 0.3 0.0 - 0.5 K/uL   Basophils Relative 0 %   Basophils Absolute 0.0 0.0 - 0.1 K/uL   Immature Granulocytes 0 %   Abs Immature Granulocytes 0.02 0.00 - 0.07 K/uL  Comprehensive metabolic panel     Status: Abnormal   Collection Time: 05/26/24 12:58 PM  Result Value Ref Range   Sodium 136 135 - 145 mmol/L   Potassium 4.2 3.5 - 5.1 mmol/L   Chloride 105 98 - 111 mmol/L   CO2 22 22 - 32 mmol/L   Glucose, Bld 84 70 - 99 mg/dL   BUN <5 (L) 6 - 20 mg/dL   Creatinine, Ser 9.41 0.44 - 1.00 mg/dL   Calcium 8.7 (L) 8.9 - 10.3 mg/dL   Total Protein 6.0 (L) 6.5 - 8.1 g/dL   Albumin 3.1 (L) 3.5 - 5.0 g/dL   AST 12 (L) 15 - 41 U/L   ALT 9 0 - 44 U/L   Alkaline Phosphatase 27 (L) 38 - 126 U/L   Total Bilirubin 0.4 0.0 - 1.2 mg/dL   GFR, Estimated >39 >39 mL/min   Anion gap 9 5 - 15  Amylase     Status: Abnormal   Collection Time: 05/26/24 12:58 PM  Result Value Ref Range   Amylase 104 (H) 28 - 100 U/L  Lipase, blood     Status: None   Collection Time: 05/26/24 12:58 PM  Result Value Ref Range   Lipase 21 11 - 51 U/L    US  ABDOMEN LIMITED RUQ (  LIVER/GB) Result Date: 05/26/2024 CLINICAL DATA:  Right upper quadrant pain EXAM: ULTRASOUND ABDOMEN LIMITED RIGHT UPPER QUADRANT COMPARISON:  None Available.  FINDINGS: Gallbladder: No gallstones or wall thickening visualized. Layering sludge within the gallbladder. Positive sonographic Murphy sign noted by sonographer. Common bile duct: Diameter: 3 mm Liver: No focal lesion identified. Within normal limits in parenchymal echogenicity. Portal vein is patent on color Doppler imaging with normal direction of blood flow towards the liver. Other: None. IMPRESSION: Layering sludge within the gallbladder with positive sonographic Murphy sign. No gallbladder wall thickening or pericholecystic fluid. Findings are equivocal for acute cholecystitis. If there is continued clinical concern, consider further evaluation with nuclear medicine HIDA scan. Electronically Signed   By: Limin  Xu M.D.   On: 05/26/2024 15:38     MDM:  Tylenol  1000mg  give PO for pain relief. Patient reports moderate improvement of pain. Prelabor/PROM will be ruled out with a speculum exam/preterm labor. No concern of preterm labor. CBC, CMP, lipase, RUQ ultrasound ordered to rule out gallbladder related issues and check on liver enzymes.  MAU Course:  A Gallbladder sludge  [redacted] weeks gestation of pregnancy  Medical screening exam complete  Reviewed results of u/s with Dr. Abigail and presentation- MD recommends confirming no concern for acute cholecystitis with general surgery before discharge but low suspicion based on labs and exam.   Reviewed with general surgery- no leukocytosis and absence of n/v reassuring that pain is more related to sludge than any acute process  Warning signs of when to return to MAU reviewed at length.   P   Discharge from MAU in stable condition with routine precautions Follow up at [facility] as scheduled for ongoing prenatal care  Allergies as of 05/26/2024   No Known Allergies      Medication List     STOP taking these medications    fluconazole  150 MG tablet Commonly known as: DIFLUCAN        TAKE these medications     acetaminophen  325 MG tablet Commonly known as: Tylenol  Take 2 tablets (650 mg total) by mouth every 6 (six) hours.   doxylamine (Sleep) 25 MG tablet Commonly known as: UNISOM Take 25 mg by mouth at bedtime as needed.   ketoconazole  2 % cream Commonly known as: NIZORAL  Apply cream twice daily to affected area.   multivitamin-prenatal 27-0.8 MG Tabs tablet Take 1 tablet by mouth daily at 12 noon.   nystatin  powder Commonly known as: MYCOSTATIN /NYSTOP  Apply twice daily to area under breasts.   pyridOXINE 25 MG tablet Commonly known as: VITAMIN B6 Take 25 mg by mouth daily.        Logan Chary Student-PA 05/26/2024 3:54 PM   Attestation of Supervision of Student:  I confirm that I have verified the information documented in the physician assistant student's note and that I have also personally reperformed the history, physical exam and all medical decision making activities.  I have verified that all services and findings are accurately documented in this student's note; and I agree with management and plan as outlined in the documentation. I have also made any necessary editorial changes.  Aleck CHRISTELLA Fireman, CNM [facility] for [employer], [facility] 05/26/2024 4:00 PM

## 2024-05-28 LAB — GC/CHLAMYDIA PROBE AMP (~~LOC~~) NOT AT ARMC
Chlamydia: NEGATIVE
Comment: NEGATIVE
Comment: NORMAL
Neisseria Gonorrhea: NEGATIVE

## 2024-06-05 ENCOUNTER — Other Ambulatory Visit: Payer: Self-pay

## 2024-06-05 ENCOUNTER — Ambulatory Visit: Admitting: Obstetrics and Gynecology

## 2024-06-05 ENCOUNTER — Encounter: Payer: Self-pay | Admitting: Obstetrics and Gynecology

## 2024-06-05 VITALS — BP 121/77 | HR 98 | Wt 164.2 lb

## 2024-06-05 DIAGNOSIS — Z3490 Encounter for supervision of normal pregnancy, unspecified, unspecified trimester: Secondary | ICD-10-CM

## 2024-06-05 DIAGNOSIS — Z3A18 18 weeks gestation of pregnancy: Secondary | ICD-10-CM | POA: Diagnosis not present

## 2024-06-05 DIAGNOSIS — Z3492 Encounter for supervision of normal pregnancy, unspecified, second trimester: Secondary | ICD-10-CM | POA: Diagnosis not present

## 2024-06-05 DIAGNOSIS — D509 Iron deficiency anemia, unspecified: Secondary | ICD-10-CM

## 2024-06-05 DIAGNOSIS — D563 Thalassemia minor: Secondary | ICD-10-CM | POA: Diagnosis not present

## 2024-06-05 NOTE — Patient Instructions (Signed)
 Topical clotrimazole or topical hydrocortisone

## 2024-06-05 NOTE — Progress Notes (Signed)
   PRENATAL VISIT NOTE  Subjective:  Anne Young is a 33 y.o. G3P1011 at [redacted]w[redacted]d being seen today for ongoing prenatal care.  She is currently monitored for the following issues for this low-risk pregnancy and has Alpha thalassemia silent carrier; Encounter for supervision of low-risk pregnancy, antepartum; and Iron deficiency anemia on their problem list.  Patient reports intermittent pelvic pressure.  Contractions: Not present. Vag. Bleeding: None.  Movement: Present. Denies leaking of fluid.   The following portions of the patient's history were reviewed and updated as appropriate: allergies, current medications, past family history, past medical history, past social history, past surgical history and problem list.   Objective:   Vitals:   06/05/24 1452  BP: 121/77  Pulse: 98  Weight: 164 lb 3.2 oz (74.5 kg)    Fetal Status:  Fetal Heart Rate (bpm): 153   Movement: Present    General: Alert, oriented and cooperative. Patient is in no acute distress.  Skin: Skin is warm and dry. No rash noted.   Cardiovascular: Normal heart rate noted  Respiratory: Normal respiratory effort, no problems with respiration noted  Abdomen: Soft, gravid, appropriate for gestational age.  Pain/Pressure: Present (pelvic pressure)     Pelvic: Cervical exam deferred        Extremities: Normal range of motion.  Edema: None  Mental Status: Normal mood and affect. Normal behavior. Normal judgment and thought content.   Assessment and Plan:  Pregnancy: G3P1011 at [redacted]w[redacted]d 1. Encounter for supervision of low-risk pregnancy, antepartum (Primary) AFP today Check Ucx since intermittent pelvic pressure.  Has anatomy US  scheduled for 12/15.   2. Pregnancy with 18 completed weeks gestation  3. Iron deficiency anemia, unspecified iron deficiency anemia type HgB was 10.3. Will check anemia panel although suspect iron def due to MCV being low.   4. Alpha thalassemia silent carrier Horizon previously positive.  GC  referral placed last visit. If possible, she would like this pregnancy tested early for SCT. Reviewed to my knowledge, no way to do this unless amnio which I would not recommend (nor does she want.). She will check with genetic counselors next week.   Preterm labor symptoms and general obstetric precautions including but not limited to vaginal bleeding, contractions, leaking of fluid and fetal movement were reviewed in detail with the patient. Please refer to After Visit Summary for other counseling recommendations.   No follow-ups on file.  Future Appointments  Date Time Provider Department Center  06/11/2024  2:00 PM [facility] PROVIDER 1 [facility]  06/11/2024  2:30 PM [facility] US2 [facility]  07/03/2024  4:15 PM Littie Olam LABOR, NP [facility]  07/31/2024  8:20 AM [facility] LAB [facility]  07/31/2024  9:15 AM Jerilynn Longs, NP [facility]  08/28/2024  2:35 PM [facility]-GENERAL 2 [facility]  09/11/2024  1:55 PM [facility]-GENERAL 2 [facility]  09/25/2024  1:55 PM [facility]-GENERAL 2 [facility]  10/09/2024  1:55 PM WMC-GENERAL 2 [facility]  10/16/2024  1:55 PM WMC-GENERAL 2 [facility]  10/23/2024  2:35 PM W[facility]-GENERAL 2 [facility]  10/30/2024  2:35 PM W[facility]-GENERAL 2 [facility]  11/05/2024  1:55 PM W[facility]-GENERAL 2 [facility]    Vina Solian, MD

## 2024-06-07 LAB — AFP, SERUM, OPEN SPINA BIFIDA
AFP MoM: 0.76
AFP Value: 35.7 ng/mL
Gest. Age on Collection Date: 18.4 wk
Maternal Age At EDD: 34.7 a
OSBR Risk 1 IN: 10000
Test Results:: NEGATIVE
Weight: 164 [lb_av]

## 2024-06-07 LAB — IRON AND TIBC
Iron Saturation: 17 % (ref 15–55)
Iron: 63 ug/dL (ref 27–159)
Total Iron Binding Capacity: 376 ug/dL (ref 250–450)
UIBC: 313 ug/dL (ref 131–425)

## 2024-06-07 LAB — FOLATE: Folate: 6.3 ng/mL (ref >3.0)

## 2024-06-07 LAB — FERRITIN: Ferritin: 25 ng/mL (ref 15–150)

## 2024-06-07 LAB — VITAMIN B12: Vitamin B-12: 380 pg/mL (ref 232–1245)

## 2024-06-08 ENCOUNTER — Ambulatory Visit: Payer: Self-pay | Admitting: Obstetrics and Gynecology

## 2024-06-08 LAB — CULTURE, OB URINE

## 2024-06-08 LAB — URINE CULTURE, OB REFLEX

## 2024-06-11 ENCOUNTER — Ambulatory Visit (HOSPITAL_BASED_OUTPATIENT_CLINIC_OR_DEPARTMENT_OTHER)

## 2024-06-11 ENCOUNTER — Other Ambulatory Visit: Attending: Obstetrics and Gynecology

## 2024-06-11 ENCOUNTER — Other Ambulatory Visit: Payer: Self-pay | Admitting: Family Medicine

## 2024-06-11 ENCOUNTER — Ambulatory Visit

## 2024-06-11 VITALS — BP 119/58 | HR 95

## 2024-06-11 DIAGNOSIS — Z3A19 19 weeks gestation of pregnancy: Secondary | ICD-10-CM

## 2024-06-11 DIAGNOSIS — Z3201 Encounter for pregnancy test, result positive: Secondary | ICD-10-CM | POA: Insufficient documentation

## 2024-06-11 DIAGNOSIS — D696 Thrombocytopenia, unspecified: Secondary | ICD-10-CM | POA: Diagnosis not present

## 2024-06-11 DIAGNOSIS — O99012 Anemia complicating pregnancy, second trimester: Secondary | ICD-10-CM | POA: Diagnosis not present

## 2024-06-11 DIAGNOSIS — D563 Thalassemia minor: Secondary | ICD-10-CM

## 2024-06-11 DIAGNOSIS — Z148 Genetic carrier of other disease: Secondary | ICD-10-CM | POA: Diagnosis not present

## 2024-06-11 DIAGNOSIS — O99112 Other diseases of the blood and blood-forming organs and certain disorders involving the immune mechanism complicating pregnancy, second trimester: Secondary | ICD-10-CM | POA: Diagnosis not present

## 2024-06-11 DIAGNOSIS — Z3490 Encounter for supervision of normal pregnancy, unspecified, unspecified trimester: Secondary | ICD-10-CM

## 2024-06-11 NOTE — Progress Notes (Cosign Needed Addendum)
 [facility] for Maternal [facility]etal [facility] at [facility] [address], [address] Phone:  [phone]   Fax:  [phone]      In-Person Genetic Counseling Clinic Note:   I spoke with 33 y.o. Anne Young today to discuss her carrier screening results. She was referred by Anne Pippin, PA-C.   Pregnancy History:    G3P1011. EGA: [redacted]w[redacted]d by LMP. EDD: 11/02/2024. Anne Young has a healthy daughter. She had one IAB for personal reasons. Denies major personal health concerns. Denies bleeding, infections, and fevers in this pregnancy. Denies using tobacco, alcohol, or street drugs in this pregnancy.   Family History:    A three-generation pedigree was created and scanned into Epic under the Media tab.  Patient reports [name] and her daughter both have sickle cell trait. She reports some of [name]'s siblings may have had sickle cell disease. Anne Young had negative carrier screening for sickle cell disease and other beta-hemoglobinopathies; however, without review of reports it is possible that another hemoglobinopathy may be present and not included on the currently available carrier screening. Anne Young is encouraged to reach out with clarification or test reports if they become available.     Patient ethnicity reported as Black and [name] ethnicity reported as Black. Denies Ashkenazi Jewish ancestry.  Family history not remarkable for consanguinity, individuals with birth defects, intellectual disability, autism spectrum disorder, multiple spontaneous abortions, still births, or unexplained neonatal death.  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 She screened negative for the other 13 conditions (including CF, SMA, and beta-hemoglobinopathies) which significantly reduces but does not eliminate the chance of being a carrier for those conditions. Please see report for  residual risk information.  We reviewed the genetics of alpha thalassemia, autosomal recessive mode of inheritance, and clinical features of these conditions. We reviewed that Anne Young will either pass down two copies of the alpha globin gene (??) OR one copy of the alpha globin gene (-?) in each pregnancy. Therefore, this pregnancy is not at increased risk for hemoglobin Bart's due to four deletions of the alpha globin genes (--/--) regardless of her reproductive partner's carrier status.  The pregnancy will be at increased risk (25%) for hemoglobin H disease if her partner is an alpha thalassemia carrier in the cis configuration (--/??).  [name] carrier screening results: [name] Anne Young previously had carrier screening which showed he is a carrier for alpha thalassemia in the trans configuration (-?/-?) due to the pathogenic 3.7 deletions of the HBA2 genes. He was also found to have sickle cell trait as he is positive for the pathogenic variant c.20A>T (p.E7V) in one of his HBB genes. He screened negative for the other 10 conditions on the test report. Please see report for residual risk information.  We reviewed that since Anne Young is a silent carrier due to the 3.7 deletion (-a/aa), there would be a 50% chance that this couple's current and any future pregnancies would be a carrier for alpha thalassemia like [name] (-a/-a) and a 50% chance to be a silent carrier for alpha thalassemia like Anne Young (-a/aa). We discussed that carriers (-a/-a) may have mild anemia but are not affected with alpha thalassemia. Therefore, we do not expect any of this couple's pregnancies to be affected with alpha thalassemia. Anne Young can inform her newborn's pediatrician so they can follow-up appropriately. Patient desires postnatal testing of alpha thalassemia for  her newborn. She declined amniocentesis. We reviewed the NBS would screen for sickle cell disease but not for alpha thalassemia.  Moreover, since Anne Young was not found to be a carrier for  sickle cell disease or other beta-hemoglobinopathies, the chance this couple's pregnancy would be affected with this condition is very low (non-zero).   Newborn Screening. The [place]  Newborn Screening (NBS) program will screen all newborn babies for cystic fibrosis, spinal muscular atrophy, hemoglobinopathies, and numerous other conditions.  Previous Testing Completed:  Low risk NIPS: Anne Young previously completed Panorama noninvasive prenatal screening (NIPS) in this pregnancy. The result is low risk, consistent with a female fetus. This screening significantly reduces but does not eliminate the chance that the current pregnancy has Down syndrome (trisomy 52), trisomy 103, trisomy 34, common sex chromosome conditions, and 22q11.2 microdeletion syndrome. Please see report for residual risk information. There are many genetic conditions that cannot be detected by NIPS.   Negative ms-AFP screening: Anne Young previously completed a maternal serum AFP screen in this pregnancy. The result is screen negative. Please see report for residual risk information. A negative result reduces the risk that the current pregnancy has an open neural tube defect. Closed neural tube defects and some open defects may not be detected by this screen.   Plan of Care:   Declined amniocentesis. Routine prenatal care. Patient desires postnatal genetics referral to discuss alpha thalassemia testing for her newborn.   Informed consent was obtained. All questions were answered.   45 minutes were spent on the date of the encounter in service to the patient including preparation, face-to-face consultation, discussion of test reports and available next steps, pedigree construction, genetic risk assessment, documentation, and care coordination.    Thank you for sharing in the care of Anne Young with us .  Please do not hesitate to contact us  at [phone] if you have any questions.   Anne Bodily, MS, [facility] Certified Genetic  Counselor   Genetic counseling student involved in appointment: No.

## 2024-06-11 NOTE — Progress Notes (Unsigned)
 Patient information  Patient Name: Anne Young  Patient MRN:   [id number]  Referring practice: {MFM Referring Provider:[id number]}  Problem List   Patient Active Problem List   Diagnosis Date Noted   Iron deficiency anemia 04/11/2024   Encounter for supervision of low-risk pregnancy, antepartum 04/05/2024   Alpha thalassemia silent carrier 08/07/2019    Maternal Fetal medicine Consult  Anne Young is a 33 y.o. G3P1011 at [redacted]w[redacted]d here for ultrasound and consultation. Anne Young is doing well today with ***no acute concerns. Today we focused on the following:   This pregnancy is complicated by gestational thrombocytopenia, with a recent platelet count of 146,000, which is within a range typically associated with a benign course and low risk of maternal or fetal complications. Gestational thrombocytopenia is the most common cause of low platelets in pregnancy and is not associated with increased risks of fetal thrombocytopenia, hemorrhage, or adverse pregnancy outcomes. The patient also has mild anemia with ferritin stores at the lower end of normal, suggesting evolving iron deficiency, and she will likely require iron supplementation throughout the pregnancy. In addition, the patient is an alpha thalassemia silent carrier, and her partner has sickle cell trait; these results have been reviewed with a genetic counselor, and the patient expresses understanding and reassurance. There are no additional fetal anomalies, and fetal growth is normal, so no further ultrasound surveillance is indicated unless new clinical concerns arise. Clinical counseling included discussion of thrombocytopenia-related considerations in pregnancy, including potential implications for neuraxial anesthesia and bleeding risk at delivery; at current platelet levels, she remains stable and is unlikely to experience adverse effects.  Recommendations: -Monitor platelet count periodically throughout pregnancy -Repeat CBC and ferritin  levels to follow anemia and iron stores -Initiate and continue iron supplementation as indicated -No additional fetal imaging unless new indications develop -Reassess platelet count closer to delivery to guide anesthesia planning -Continue routine prenatal care with reassurance regarding current stability  I spent *** minutes reviewing the patients chart, including labs and images as well as counseling the patient about her medical conditions. Greater than 50% of the time was spent in direct face-to-face patient counseling.  Delora Smaller  MFM, [place]   06/11/2024  3:39 PM   Review of Systems: A review of systems was performed and was negative except per HPI   Vitals and Physical Exam    06/11/2024    2:16 PM 06/05/2024    2:52 PM 05/26/2024    3:22 PM  Vitals with BMI  Weight  164 lbs 3 oz   Systolic 119 121 897  Diastolic 58 77 62  Pulse 95 98 75    Sitting comfortably on the sonogram table Nonlabored breathing Normal rate and rhythm Abdomen is nontender  Past pregnancies OB History  Gravida Para Term Preterm AB Living  3 1 1  0 1 1  SAB IAB Ectopic Multiple Live Births  0 1 0 0 1    # Outcome Date GA Lbr Len/2nd Weight Sex Type Anes PTL Lv  3 Current           2 Term 02/07/20 [redacted]w[redacted]d / 00:19 8 lb 5.3 oz (3.779 kg) F Vag-Spont None  LIV     Birth Comments: WDL  1 IAB              Future Appointments  Date Time Provider Department Center  07/03/2024  4:15 PM Littie Olam LABOR, NP [facility]  07/31/2024  8:20 AM [facility]  07/31/2024  9:15  AM Jerilynn Longs, NP [facility]  08/28/2024  2:35 PM [facility]-GENERAL 2 [facility]  09/11/2024  1:55 PM [facility]-GENERAL 2 [facility]  09/25/2024  1:55 PM WMC-GENERAL 2 [facility]  10/09/2024  1:55 PM WMC-GENERAL 2 [facility]  10/16/2024  1:55 PM W[facility]-GENERAL 2 [facility]  10/23/2024  2:35 PM WMC-GENERAL 2 [facility]  10/30/2024  2:35 PM WMC-GENERAL 2 [facility]  11/05/2024  1:55 PM W[facility]-GENERAL 2 [facility]

## 2024-06-25 ENCOUNTER — Ambulatory Visit: Payer: Self-pay

## 2024-07-03 ENCOUNTER — Other Ambulatory Visit: Payer: Self-pay

## 2024-07-03 ENCOUNTER — Ambulatory Visit: Admitting: Nurse Practitioner

## 2024-07-03 VITALS — BP 102/68 | HR 99 | Wt 166.0 lb

## 2024-07-03 DIAGNOSIS — O99112 Other diseases of the blood and blood-forming organs and certain disorders involving the immune mechanism complicating pregnancy, second trimester: Secondary | ICD-10-CM

## 2024-07-03 DIAGNOSIS — D509 Iron deficiency anemia, unspecified: Secondary | ICD-10-CM | POA: Diagnosis not present

## 2024-07-03 DIAGNOSIS — D696 Thrombocytopenia, unspecified: Secondary | ICD-10-CM

## 2024-07-03 DIAGNOSIS — Z3A22 22 weeks gestation of pregnancy: Secondary | ICD-10-CM | POA: Diagnosis not present

## 2024-07-03 DIAGNOSIS — O99119 Other diseases of the blood and blood-forming organs and certain disorders involving the immune mechanism complicating pregnancy, unspecified trimester: Secondary | ICD-10-CM | POA: Insufficient documentation

## 2024-07-03 DIAGNOSIS — Z3490 Encounter for supervision of normal pregnancy, unspecified, unspecified trimester: Secondary | ICD-10-CM

## 2024-07-03 DIAGNOSIS — D563 Thalassemia minor: Secondary | ICD-10-CM | POA: Diagnosis not present

## 2024-07-03 LAB — CBC
Hematocrit: 29.8 % — ABNORMAL LOW (ref 34.0–46.6)
Hemoglobin: 9.5 g/dL — ABNORMAL LOW (ref 11.1–15.9)
MCH: 25.9 pg — ABNORMAL LOW (ref 26.6–33.0)
MCHC: 31.9 g/dL (ref 31.5–35.7)
MCV: 81 fL (ref 79–97)
Platelets: 153 x10E3/uL (ref 150–450)
RBC: 3.67 x10E6/uL — ABNORMAL LOW (ref 3.77–5.28)
RDW: 13.1 % (ref 11.7–15.4)
WBC: 6.1 x10E3/uL (ref 3.4–10.8)

## 2024-07-03 NOTE — Progress Notes (Signed)
" ° °  PRENATAL VISIT NOTE  Subjective:  Anne Young is a 34 y.o. G3P1011 at [redacted]w[redacted]d being seen today for ongoing prenatal care.  She is currently monitored for the following issues for this high-risk pregnancy and has Alpha thalassemia silent carrier; Encounter for supervision of low-risk pregnancy, antepartum; Iron deficiency anemia; and Gestational thrombocytopenia on their problem list.  Patient reports no complaints.  Contractions: Not present. Vag. Bleeding: None.  Movement: Present. Denies leaking of fluid.   The following portions of the patient's history were reviewed and updated as appropriate: allergies, current medications, past family history, past medical history, past social history, past surgical history and problem list.   Objective:   Vitals:   07/03/24 1627  BP: 102/68  Pulse: 99  Weight: 166 lb (75.3 kg)    Fetal Status: Fetal Heart Rate (bpm): 155   Movement: Present     General:  Alert, oriented and cooperative. Patient is in no acute distress.  Skin: Skin is warm and dry. No rash noted.   Cardiovascular: Normal heart rate noted  Respiratory: Normal respiratory effort, no problems with respiration noted  Abdomen: Soft, gravid, appropriate for gestational age.  Pain/Pressure: Present (pelvic pressure)     Pelvic: Cervical exam deferred        Extremities: Normal range of motion.  Edema: None  Mental Status: Normal mood and affect. Normal behavior. Normal judgment and thought content.   Assessment and Plan:  Pregnancy: G3P1011 at [redacted]w[redacted]d  Encounter for supervision of low-risk pregnancy, antepartum FH, FHR appropriate  Benign gestational thrombocytopenia, antepartum CBC repeated today  Alpha thalassemia silent carrier CBC today   Iron deficiency anemia, unspecified iron deficiency anemia type Iron panel ordered CBC   [redacted] weeks gestation of pregnancy Routine Prenatal care   Preterm labor symptoms and general obstetric precautions including but not limited to  vaginal bleeding, contractions, leaking of fluid and fetal movement were reviewed in detail with the patient. Please refer to After Visit Summary for other counseling recommendations.    Future Appointments  Date Time Provider Department Center  07/31/2024  8:20 AM [facility] LAB [facility]  07/31/2024  9:15 AM Jerilynn Longs, NP [facility]  08/28/2024  2:35 PM Milly Olam LABOR, CNM [facility]  09/11/2024  1:55 PM [facility]-GENERAL 2 [facility]  09/25/2024  1:55 PM W[facility]-GENERAL 2 [facility]  10/09/2024  1:55 PM WMC-GENERAL 2 [facility]  10/16/2024  1:55 PM WMC-GENERAL 2 [facility]  10/23/2024  2:35 PM WMC-GENERAL 2 [facility]  10/30/2024  2:35 PM WMC-GENERAL 2 [facility]  11/05/2024  1:55 PM WMC-GENERAL 2 [facility]    Olam Dalton, MSN, WHNP-BC [facility] [employer]  "

## 2024-07-04 LAB — IRON,TIBC AND FERRITIN PANEL
Ferritin: 11 ng/mL — ABNORMAL LOW (ref 15–150)
Iron Saturation: 11 % — ABNORMAL LOW (ref 15–55)
Iron: 47 ug/dL (ref 27–159)
Total Iron Binding Capacity: 444 ug/dL (ref 250–450)
UIBC: 397 ug/dL (ref 131–425)

## 2024-07-15 ENCOUNTER — Ambulatory Visit: Payer: Self-pay | Admitting: Nurse Practitioner

## 2024-07-15 DIAGNOSIS — D649 Anemia, unspecified: Secondary | ICD-10-CM

## 2024-07-15 MED ORDER — FERRIC MALTOL 30 MG PO CAPS: 1.0000 | ORAL_CAPSULE | Freq: Two times a day (BID) | ORAL | 2 refills | Status: DC

## 2024-07-27 ENCOUNTER — Other Ambulatory Visit: Payer: Self-pay

## 2024-07-27 DIAGNOSIS — Z3490 Encounter for supervision of normal pregnancy, unspecified, unspecified trimester: Secondary | ICD-10-CM

## 2024-07-30 ENCOUNTER — Encounter: Admitting: Family Medicine

## 2024-07-30 ENCOUNTER — Other Ambulatory Visit

## 2024-07-31 ENCOUNTER — Other Ambulatory Visit

## 2024-07-31 ENCOUNTER — Encounter: Admitting: Student

## 2024-08-01 ENCOUNTER — Other Ambulatory Visit

## 2024-08-01 ENCOUNTER — Other Ambulatory Visit: Payer: Self-pay

## 2024-08-01 DIAGNOSIS — Z3490 Encounter for supervision of normal pregnancy, unspecified, unspecified trimester: Secondary | ICD-10-CM

## 2024-08-02 LAB — CBC
Hematocrit: 27.9 % — ABNORMAL LOW (ref 34.0–46.6)
Hemoglobin: 8.9 g/dL — ABNORMAL LOW (ref 11.1–15.9)
MCH: 25.5 pg — ABNORMAL LOW (ref 26.6–33.0)
MCHC: 31.9 g/dL (ref 31.5–35.7)
MCV: 80 fL (ref 79–97)
Platelets: 165 10*3/uL (ref 150–450)
RBC: 3.49 x10E6/uL — ABNORMAL LOW (ref 3.77–5.28)
RDW: 13.1 % (ref 11.7–15.4)
WBC: 5 10*3/uL (ref 3.4–10.8)

## 2024-08-02 LAB — GLUCOSE TOLERANCE, 2 HOURS W/ 1HR
Glucose, 1 hour: 111 mg/dL (ref 70–179)
Glucose, 2 hour: 91 mg/dL (ref 70–152)
Glucose, Fasting: 74 mg/dL (ref 70–91)

## 2024-08-02 LAB — SYPHILIS: RPR W/REFLEX TO RPR TITER AND TREPONEMAL ANTIBODIES, TRADITIONAL SCREENING AND DIAGNOSIS ALGORITHM: RPR Ser Ql: NONREACTIVE

## 2024-08-02 LAB — HIV ANTIBODY (ROUTINE TESTING W REFLEX): HIV Screen 4th Generation wRfx: NONREACTIVE

## 2024-08-06 ENCOUNTER — Encounter: Payer: Self-pay | Admitting: Certified Nurse Midwife

## 2024-08-28 ENCOUNTER — Encounter: Admitting: Advanced Practice Midwife

## 2024-09-11 ENCOUNTER — Encounter: Admitting: Certified Nurse Midwife

## 2024-09-25 ENCOUNTER — Encounter: Admitting: Physician Assistant

## 2024-10-09 ENCOUNTER — Encounter: Admitting: Family Medicine

## 2024-10-16 ENCOUNTER — Encounter

## 2024-10-23 ENCOUNTER — Encounter

## 2024-10-30 ENCOUNTER — Encounter

## 2024-11-05 ENCOUNTER — Encounter
